# Patient Record
Sex: Male | Born: 1935
Health system: Southern US, Community
[De-identification: ages and names within clinical notes are randomized; demographics above are authoritative.]

## PROBLEM LIST (undated history)

## (undated) DIAGNOSIS — N400 Enlarged prostate without lower urinary tract symptoms: Secondary | ICD-10-CM

## (undated) DIAGNOSIS — M1990 Unspecified osteoarthritis, unspecified site: Secondary | ICD-10-CM

## (undated) DIAGNOSIS — N281 Cyst of kidney, acquired: Secondary | ICD-10-CM

## (undated) DIAGNOSIS — S32000A Wedge compression fracture of unspecified lumbar vertebra, initial encounter for closed fracture: Secondary | ICD-10-CM

## (undated) DIAGNOSIS — Z8673 Personal history of transient ischemic attack (TIA), and cerebral infarction without residual deficits: Secondary | ICD-10-CM

## (undated) DIAGNOSIS — H9193 Unspecified hearing loss, bilateral: Secondary | ICD-10-CM

## (undated) DIAGNOSIS — F0390 Unspecified dementia without behavioral disturbance: Secondary | ICD-10-CM

## (undated) DIAGNOSIS — R32 Unspecified urinary incontinence: Secondary | ICD-10-CM

## (undated) DIAGNOSIS — E7800 Pure hypercholesterolemia, unspecified: Secondary | ICD-10-CM

## (undated) DIAGNOSIS — E119 Type 2 diabetes mellitus without complications: Secondary | ICD-10-CM

## (undated) DIAGNOSIS — R413 Other amnesia: Secondary | ICD-10-CM

## (undated) DIAGNOSIS — H269 Unspecified cataract: Secondary | ICD-10-CM

## (undated) DIAGNOSIS — I10 Essential (primary) hypertension: Secondary | ICD-10-CM

## (undated) HISTORY — DX: Benign prostatic hyperplasia without lower urinary tract symptoms: N40.0

## (undated) HISTORY — DX: Unspecified cataract: H26.9

## (undated) HISTORY — DX: Unspecified urinary incontinence: R32

## (undated) HISTORY — DX: Unspecified hearing loss, bilateral: H91.93

## (undated) HISTORY — DX: Cyst of kidney, acquired: N28.1

## (undated) HISTORY — PX: BACK SURGERY: SHX140

## (undated) HISTORY — DX: Personal history of transient ischemic attack (TIA), and cerebral infarction without residual deficits: Z86.73

## (undated) HISTORY — DX: Essential (primary) hypertension: I10

## (undated) HISTORY — DX: Unspecified osteoarthritis, unspecified site: M19.90

## (undated) HISTORY — DX: Other amnesia: R41.3

## (undated) HISTORY — DX: Unspecified dementia, unspecified severity, without behavioral disturbance, psychotic disturbance, mood disturbance, and anxiety: F03.90

---

## 1998-06-12 ENCOUNTER — Ambulatory Visit (HOSPITAL_COMMUNITY): Admission: RE | Admit: 1998-06-12 | Discharge: 1998-06-12 | Payer: Self-pay | Admitting: Gastroenterology

## 2000-01-21 ENCOUNTER — Ambulatory Visit (HOSPITAL_COMMUNITY): Admission: RE | Admit: 2000-01-21 | Discharge: 2000-01-21 | Payer: Self-pay | Admitting: Gastroenterology

## 2000-01-21 ENCOUNTER — Encounter (INDEPENDENT_AMBULATORY_CARE_PROVIDER_SITE_OTHER): Payer: Self-pay | Admitting: Specialist

## 2000-02-11 ENCOUNTER — Encounter: Admission: RE | Admit: 2000-02-11 | Discharge: 2000-05-11 | Payer: Self-pay | Admitting: Gastroenterology

## 2000-06-22 ENCOUNTER — Encounter: Admission: RE | Admit: 2000-06-22 | Discharge: 2000-09-20 | Payer: Self-pay | Admitting: Gastroenterology

## 2000-08-02 HISTORY — PX: CARPAL TUNNEL RELEASE: SHX101

## 2001-01-19 ENCOUNTER — Encounter: Payer: Self-pay | Admitting: *Deleted

## 2001-01-23 ENCOUNTER — Ambulatory Visit (HOSPITAL_BASED_OUTPATIENT_CLINIC_OR_DEPARTMENT_OTHER): Admission: RE | Admit: 2001-01-23 | Discharge: 2001-01-23 | Payer: Self-pay | Admitting: Orthopedic Surgery

## 2001-03-27 ENCOUNTER — Encounter: Admission: RE | Admit: 2001-03-27 | Discharge: 2001-04-14 | Payer: Self-pay | Admitting: Orthopedic Surgery

## 2003-04-26 ENCOUNTER — Encounter: Payer: Self-pay | Admitting: Internal Medicine

## 2003-04-26 ENCOUNTER — Encounter: Admission: RE | Admit: 2003-04-26 | Discharge: 2003-04-26 | Payer: Self-pay | Admitting: Internal Medicine

## 2003-08-03 HISTORY — PX: HEMORRHOID SURGERY: SHX153

## 2004-02-13 ENCOUNTER — Ambulatory Visit (HOSPITAL_COMMUNITY): Admission: RE | Admit: 2004-02-13 | Discharge: 2004-02-13 | Payer: Self-pay | Admitting: General Surgery

## 2004-02-13 ENCOUNTER — Encounter (INDEPENDENT_AMBULATORY_CARE_PROVIDER_SITE_OTHER): Payer: Self-pay | Admitting: Specialist

## 2004-08-02 HISTORY — PX: TRANSURETHRAL RESECTION OF PROSTATE: SHX73

## 2004-09-17 ENCOUNTER — Ambulatory Visit (HOSPITAL_COMMUNITY): Admission: RE | Admit: 2004-09-17 | Discharge: 2004-09-17 | Payer: Self-pay | Admitting: Gastroenterology

## 2004-09-17 ENCOUNTER — Encounter (INDEPENDENT_AMBULATORY_CARE_PROVIDER_SITE_OTHER): Payer: Self-pay | Admitting: Specialist

## 2005-05-26 ENCOUNTER — Inpatient Hospital Stay (HOSPITAL_COMMUNITY): Admission: RE | Admit: 2005-05-26 | Discharge: 2005-05-28 | Payer: Self-pay | Admitting: Urology

## 2005-05-26 ENCOUNTER — Encounter (INDEPENDENT_AMBULATORY_CARE_PROVIDER_SITE_OTHER): Payer: Self-pay | Admitting: Specialist

## 2006-01-27 ENCOUNTER — Encounter: Admission: RE | Admit: 2006-01-27 | Discharge: 2006-01-27 | Payer: Self-pay | Admitting: Endocrinology

## 2006-02-24 ENCOUNTER — Ambulatory Visit (HOSPITAL_COMMUNITY): Admission: RE | Admit: 2006-02-24 | Discharge: 2006-02-24 | Payer: Self-pay | Admitting: Urology

## 2006-04-29 ENCOUNTER — Inpatient Hospital Stay (HOSPITAL_COMMUNITY): Admission: RE | Admit: 2006-04-29 | Discharge: 2006-05-03 | Payer: Self-pay | Admitting: Urology

## 2006-04-29 ENCOUNTER — Encounter (INDEPENDENT_AMBULATORY_CARE_PROVIDER_SITE_OTHER): Payer: Self-pay | Admitting: Specialist

## 2006-05-05 ENCOUNTER — Emergency Department (HOSPITAL_COMMUNITY): Admission: EM | Admit: 2006-05-05 | Discharge: 2006-05-06 | Payer: Self-pay | Admitting: Specialist

## 2006-05-06 ENCOUNTER — Ambulatory Visit (HOSPITAL_COMMUNITY): Admission: RE | Admit: 2006-05-06 | Discharge: 2006-05-06 | Payer: Self-pay | Admitting: Urology

## 2006-05-06 ENCOUNTER — Encounter: Payer: Self-pay | Admitting: Vascular Surgery

## 2007-02-24 ENCOUNTER — Ambulatory Visit: Payer: Self-pay | Admitting: Internal Medicine

## 2007-03-23 ENCOUNTER — Ambulatory Visit: Payer: Self-pay | Admitting: Internal Medicine

## 2007-06-21 ENCOUNTER — Encounter: Payer: Self-pay | Admitting: Internal Medicine

## 2007-06-21 DIAGNOSIS — E119 Type 2 diabetes mellitus without complications: Secondary | ICD-10-CM | POA: Insufficient documentation

## 2007-07-19 ENCOUNTER — Ambulatory Visit: Payer: Self-pay | Admitting: Internal Medicine

## 2007-07-19 DIAGNOSIS — J8410 Pulmonary fibrosis, unspecified: Secondary | ICD-10-CM

## 2007-10-24 ENCOUNTER — Ambulatory Visit: Payer: Self-pay | Admitting: Internal Medicine

## 2007-11-28 ENCOUNTER — Encounter (INDEPENDENT_AMBULATORY_CARE_PROVIDER_SITE_OTHER): Payer: Self-pay | Admitting: *Deleted

## 2007-12-03 ENCOUNTER — Encounter: Payer: Self-pay | Admitting: Internal Medicine

## 2007-12-04 ENCOUNTER — Telehealth (INDEPENDENT_AMBULATORY_CARE_PROVIDER_SITE_OTHER): Payer: Self-pay | Admitting: *Deleted

## 2009-02-04 ENCOUNTER — Encounter: Admission: RE | Admit: 2009-02-04 | Discharge: 2009-02-04 | Payer: Self-pay | Admitting: Sports Medicine

## 2009-09-04 ENCOUNTER — Ambulatory Visit: Payer: Self-pay | Admitting: Internal Medicine

## 2009-09-04 DIAGNOSIS — R05 Cough: Secondary | ICD-10-CM

## 2009-09-04 DIAGNOSIS — R059 Cough, unspecified: Secondary | ICD-10-CM | POA: Insufficient documentation

## 2010-04-07 ENCOUNTER — Telehealth (INDEPENDENT_AMBULATORY_CARE_PROVIDER_SITE_OTHER): Payer: Self-pay | Admitting: *Deleted

## 2010-04-14 ENCOUNTER — Encounter: Payer: Self-pay | Admitting: Internal Medicine

## 2010-04-21 ENCOUNTER — Ambulatory Visit: Payer: Self-pay | Admitting: Internal Medicine

## 2010-06-03 ENCOUNTER — Telehealth: Payer: Self-pay | Admitting: Internal Medicine

## 2010-06-08 ENCOUNTER — Telehealth: Payer: Self-pay | Admitting: Internal Medicine

## 2010-06-11 ENCOUNTER — Telehealth (INDEPENDENT_AMBULATORY_CARE_PROVIDER_SITE_OTHER): Payer: Self-pay | Admitting: *Deleted

## 2010-06-12 ENCOUNTER — Ambulatory Visit (HOSPITAL_COMMUNITY)
Admission: RE | Admit: 2010-06-12 | Discharge: 2010-06-12 | Payer: Self-pay | Source: Home / Self Care | Admitting: Internal Medicine

## 2010-06-12 ENCOUNTER — Encounter: Payer: Self-pay | Admitting: Internal Medicine

## 2010-07-24 ENCOUNTER — Ambulatory Visit: Payer: Self-pay | Admitting: Internal Medicine

## 2010-07-29 ENCOUNTER — Encounter: Payer: Self-pay | Admitting: Internal Medicine

## 2010-08-02 DIAGNOSIS — S32000A Wedge compression fracture of unspecified lumbar vertebra, initial encounter for closed fracture: Secondary | ICD-10-CM

## 2010-08-02 HISTORY — DX: Wedge compression fracture of unspecified lumbar vertebra, initial encounter for closed fracture: S32.000A

## 2010-08-05 ENCOUNTER — Ambulatory Visit (HOSPITAL_COMMUNITY)
Admission: RE | Admit: 2010-08-05 | Discharge: 2010-08-05 | Payer: Self-pay | Source: Home / Self Care | Attending: Internal Medicine | Admitting: Internal Medicine

## 2010-09-03 NOTE — Assessment & Plan Note (Signed)
Summary: Pulmonary/ ext ov restart prilosec and pepcid   Primary Provider/Referring Provider:  no primary  CC:  Acute visit.  Pt c/o cough x 2 months.  Cough is non prod.  Cough is worse when he eats.  He feels like food gets stuck in throat.  Marland Kitchen  History of Present Illness: 13 yowm  initially seen here in July 2008 for evaluation of a severe cough associated with probable musculoskeletal chest pain and very mild evidence of pulmonary fibrosis.  I reasoned that the cough was disproportionate  to the fibrosis and might be due to reflux.  Therefore Rx> Zegerd t40 mg at bedtime with apparent complete elimination of symptoms.   Last seen 10/29/07 rec maintain zegerid  September 04, 2009 much worse since stopped zegreid due to insurance issues but esp worse cough x 2 months.  Cough is non prod.  Cough is worse when he eats.  He feels like food gets stuck in throat.  Denies sign sob but very sedentary.  Pt denies any significant sore throat,itching, sneezing,  nasal congestion or excess secretions,  fever, chills, sweats, unintended wt loss, pleuritic or exertional cp, hempoptysis, change in activity tolerance  orthopnea pnd or leg swelling.  Pt also denies any obvious fluctuation in symptoms with weather or environmental change or other alleviating or aggravating factors.        Current Medications (verified): 1)  Mobic 7.5 Mg  Tabs (Meloxicam) .... Once Daily 2)  Crestor 5 Mg  Tabs (Rosuvastatin Calcium) 3)  Actos 45 Mg  Tabs (Pioglitazone Hcl) .... Once Daily 4)  Oxybutynin Chloride 10 Mg  Tb24 (Oxybutynin Chloride) .... Once Daily  Allergies (verified): 1)  ! Celebrex  Past History:  Past Medical History: DM (ICD-250.00)    Vital Signs:  Patient profile:   77 year old male Weight:      216.38 pounds O2 Sat:      97 % on Room air Temp:     97.9 degrees F oral Pulse rate:   69 / minute BP sitting:   122 / 80  (left arm)  Vitals Entered By: Vernie Murders (September 04, 2009 11:58  AM)  O2 Flow:  Room air  Physical Exam  Additional Exam:  amb hoarse wm in nad, very hard of hearing HEENT: nl dentition, turbinates, and orophanx. Nl external ear canals without cough reflex. Frequent throat clearing. Neck without JVD/Nodes/TM Lungs clear to A and P bilaterally without cough on insp or exp maneuvers, mild pseudowheeze was appreciated RRR no s3 or murmur or increase in P2 Abd soft and benign with nl excursion in the supine position. No bruits or organomegaly Ext warm without calf tenderness, cyanosis clubbing or edema Skin warm and dry without lesions    CXR  Procedure date:  09/04/2009  Findings:      mild pf without progression  Impression & Recommendations:  Problem # 1:  COUGH (ICD-786.2) The most common causes of chronic cough in immunocompetent adults include: upper airway cough syndrome (UACS), previously referred to as postnasal drip syndrome,  caused by variety of rhinosinus conditions; (2) asthma; (3) GERD; (4) chronic bronchitis from cigarette smoking or other inhaled environmental irritants; (5) nonasthmatic eosinophilic bronchitis; and (6) bronchiectasis. These conditions, singly or in combination, have accounted for up to 94% of the causes of chronic cough in prospective studies.  Based on assoc dysphagia and response to zegerid this is almost certainly Upper airway cough syndrome, so named because it's frequently impossible to sort  out how much is LPR vs CR/sinusitis with freq throat clearing generating secondary extra esophageal GERD from wide swings in gastric pressure that occur with throat clearing, promoting self use of mint and menthol lozenges that reduce the lower esophageal sphincter tone and exacerbate the problem further.  These symptoms are easily confused with asthma/copd by even experienced pulmonogists because they overlap so much. These are the same pts who not infrequently have failed to tolerate ace inhibitors,  dry powder inhalers or  biphosphonates or report having reflux symptoms that don't respond to standard doses of PPI   See instructions for specific recommendations   Problem # 2:  PULMONARY FIBROSIS ILD POST INFLAMMATORY CHRONIC (ICD-515) No evidence of progression and likely related to GERD though may not be cause and effect.  Other Orders: Est. Patient Level IV (99214) T-2 View CXR (71020TC)  Patient Instructions: 1)  Start Prilosec 20 mg Take  one 30-60 min before first meal of the day and pepcid 20 mg at bedtime  2)  GERD (REFLUX)  is a common cause of respiratory symptoms. It commonly presents without heartburn and can be treated with medication, but also with lifestyle changes including avoidance of late meals, excessive alcohol, smoking cessation, and avoid fatty foods, chocolate, peppermint, colas, red wine, and acidic juices such as orange juice. NO MINT OR MENTHOL PRODUCTS SO NO COUGH DROPS  3)  USE SUGARLESS CANDY INSTEAD (jolley ranchers)  4)  NO OIL BASED VITAMINS  5)  Please schedule a follow-up appointment in 6 months with cxr, sooner if condition worsens  Appended Document: Pulmonary/ ext ov restart prilosec and pepcid copy to Dr Dagoberto Ligas  Appended Document: Pulmonary/ ext ov restart prilosec and pepcid Copy was faxed to [facility] via [facility].

## 2010-09-03 NOTE — Assessment & Plan Note (Signed)
Summary: Pulmonary/ ext f/u ov for dysphagia/ ? es dysmotility   Primary Rodney Wigger/Referring Jaala Bohle:  Trula Slade   CC:  Cough- the same.  History of Present Illness: 17 yowm  initially seen here in July 2008 for evaluation of a severe cough associated with probable musculoskeletal chest pain and very mild evidence of pulmonary fibrosis.  I reasoned that the cough was disproportionate  to the fibrosis and might be due to reflux.  Therefore Rx> Zegerd t40 mg at bedtime with apparent complete elimination of symptoms.   Last seen 10/29/07 rec maintain zegerid  September 04, 2009 much worse since stopped zegreid due to insurance issues but esp worse cough x 2 months.  Cough is non prod.  Cough is worse when he eats.  He feels like food gets stuck in throat.  Denies sign sob but very sedentary.  rec prilosec 20 mg one  ac and pepcid 20 mg one at bedtime never never followed these instructions, wife didn't think he had reflux because she says she has it and knows what this is (written explanation of the extraesophageal manifestations reviewed in writing on instruction sheet given to them but they ignored it).   April 21, 2010 6 month followup.  Pt states that cough has worsened since last seen.  Cough is dry and hacky and get worse after eating "gets strangled".  Breathing okay.   No h/o asp pna.  rec Prevacid (lansoprazole) 30 mg Take  one 30-60 min before first meal of the day  Pepcid 20 mg one at bedtime GERD (REFLUX)   ei35  If not convinced your better in one month arrange a barium swallow > done 06/12/10 D3 diet and full barium swallow rec but note done  July 24, 2010 ov cc cough no better but not compliant with ppi or diet. Pt denies any significant sore throat,, itching, sneezing,  nasal congestion or excess secretions,  fever, chills, sweats, unintended wt loss, pleuritic or exertional cp, hempoptysis, change in activity tolerance  orthopnea pnd or leg swelling.  Pt also denies any  obvious fluctuation in symptoms with weather or environmental change or other alleviating or aggravating factors.             Current Medications (verified): 1)  Mobic 7.5 Mg  Tabs (Meloxicam) .Marland Kitchen.. 1 Once Daily As Needed 2)  Crestor 5 Mg  Tabs (Rosuvastatin Calcium) .Marland Kitchen.. 1 Once Daily 3)  Actos 45 Mg  Tabs (Pioglitazone Hcl) .Marland Kitchen.. 1 Once Daily 4)  Oxybutynin Chloride 10 Mg  Tb24 (Oxybutynin Chloride) .Marland Kitchen.. 1 Once Daily 5)  Aspirin 81 Mg Tbec (Aspirin) .Marland Kitchen.. 1 Once Daily  Allergies (verified): 1)  ! Celebrex  Past History:  Past Medical History: DM (ICD-250.00) Chronic cough/ ? ild........................................Marland KitchenWert      - No desat x 3 laps x 185 each April 21, 2010       - ST eval 06/12/10 D3 Diet, thin liquids/ full swallow rec    Vital Signs:  Patient profile:   77 year old male Weight:      218 pounds O2 Sat:      97 % on Room air Temp:     97.6 degrees F oral Pulse rate:   78 / minute BP sitting:   110 / 78  (left arm)  Vitals Entered By: Vernie Murders (July 24, 2010 11:06 AM)  O2 Flow:  Room air  Physical Exam  Additional Exam:  amb hoarse wm in nad, very hard of hearing wt 216  April 21, 2010  > 218 July 24, 2010  HEENT: nl dentition, turbinates, and orophanx. Nl external ear canals without cough reflex. Frequent throat clearing. Neck without JVD/Nodes/TM Lungs minimal insp crackles on insp without cough on insp or exp maneuvers, mild pseudowheeze was appreciated RRR no s3 or murmur or increase in P2 Abd soft and benign with nl excursion in the supine position. No bruits or organomegaly Ext warm without calf tenderness, cyanosis clubbing or edema Skin warm and dry without lesions    Impression & Recommendations:  Problem # 1:  COUGH (ICD-786.2)  The most common causes of chronic cough in immunocompetent adults include: upper airway cough syndrome (UACS), previously referred to as postnasal drip syndrome,  caused by variety of  rhinosinus conditions; (2) asthma; (3) GERD; (4) chronic bronchitis from cigarette smoking or other inhaled environmental irritants; (5) nonasthmatic eosinophilic bronchitis; and (6) bronchiectasis. These conditions, singly or in combination, have accounted for up to 94% of the causes of chronic cough in prospective studies.  Based on assoc dysphagia and response to zegerid this is almost certainly Upper airway cough syndrome, so named because it's frequently impossible to sort out how much is LPR vs CR/sinusitis with freq throat clearing generating secondary extra esophageal GERD from wide swings in gastric pressure that occur with throat clearing, promoting self use of mint and menthol lozenges that reduce the lower esophageal sphincter tone and exacerbate the problem further.  These symptoms are easily confused with asthma/copd by even experienced pulmonogists because they overlap so much. These are the same pts who not infrequently have failed to tolerate ace inhibitors,  dry powder inhalers or biphosphonates or report having reflux symptoms that don't respond to standard doses of PPI   NB the  ramp to expected improvement (and for that matter, worsening, if a chronic effective medication is stopped)  can be measured in weeks, not days, a common misconception because this is not Heartburn with no immediate cause and effect relationship so that response to therapy or lack thereof can be very difficult to assess.  This is the same conversation we had on last ov. See instructions for specific recommendations - next step is full  barium swallow  Need to verify he's following instructions before adding new ones  Orders: Est. Patient Level IV (16109)  Problem # 2:  PULMONARY FIBROSIS ILD POST INFLAMMATORY CHRONIC (ICD-515)  No evidence of progression and likely related to GERD though may not be cause and effect.  Only way to tell is follow this over time and see if progression despite aggressive rx of  GERD  Medications Added to Medication List This Visit: 1)  Prevacid Solutab 30 Mg Tbdp (Lansoprazole) .... By mouth daily. take one half hour before eating. 2)  Pepcid 20 Mg Tabs (Famotidine) .... Take one by mouth at bedtime  Patient Instructions: 1)  Prevacid (lansoprazole) 30 mg Take  one 30-60 min before first meal of the day  2)  Pepcid 20 mg one at bedtime 3)  GERD (REFLUX)  is a common cause of respiratory symptoms. It commonly presents without heartburn and can be treated with medication, but also with lifestyle changes including avoidance of late meals, excessive alcohol, smoking cessation, and avoid fatty foods, chocolate, peppermint, colas, red wine, and acidic juices such as orange juice. NO MINT OR MENTHOL PRODUCTS SO NO COUGH DROPS  4)  USE SUGARLESS CANDY INSTEAD (jolley ranchers)  5)  NO OIL BASED VITAMINS  6)  Return to office in 3 months,  sooner if needed with cxr on return 7)  BRING YOUR MEDICATIONS WITH YOU!!!

## 2010-09-03 NOTE — Progress Notes (Signed)
Summary: nos appt  Phone Note Call from Patient   Caller: [email or web address]  Call For: wert Summary of Call: LMTCB x2 to rsc nos from 11/1. Initial call taken by: Darletta Moll,  June 03, 2010 3:01 PM

## 2010-09-03 NOTE — Miscellaneous (Signed)
Summary: Orders Update  Clinical Lists Changes  Orders: Added new Referral order of Misc. Referral (Misc. Ref) - Signed 

## 2010-09-03 NOTE — Letter (Signed)
Summary: [facility]  [address]   [place], [place] [address]   Phone: [phone]  Fax: [phone]    04/14/2010  Rica Mote [address] [place], [place]  [address]  Dear Mr. BASALDUA,   Our records indicate that you are now due for a followup appointment with Dr. Sandrea Hughs.  Please contact our office at [phone] to schedule appointment.  Thank You.        Sincerely,   Sandrea Hughs, MD

## 2010-09-03 NOTE — Progress Notes (Signed)
Summary: needs rov with cxr- letter mailed  ---- Converted from flag ---- ---- 04/05/2010 11:40 PM, Nyoka Cowden MD wrote: needs f/u ov with cxr and walking sats ------------------------------  [facility]  Vernie Murders  April 07, 2010 2:56 PM  LMOMTCB Vernie Murders  April 08, 2010 4:09 PM  LMOMTCB Vernie Murders  April 09, 2010 5:01 PM  Letter mailed Vernie Murders  April 14, 2010 11:46 AM

## 2010-09-03 NOTE — Progress Notes (Signed)
Summary: cough no better-schedule barium swallow  Phone Note Call from Patient   Caller: Patient Summary of Call: need to talk to nurse to find out what kind of test he is suppose to have done. Initial call taken by: Rickard Patience,  June 08, 2010 2:31 PM  Follow-up for Phone Call        [facility] Vernie Murders  June 08, 2010 3:46 PM  Returning phone call.Darletta Moll  June 09, 2010 11:56 AM  Spoke to pt wife and she states that the pt is no better and he wants to proceed with barium swallow. Per last OV note pt was to call and if no better per MW ok to order swallow. Order placed and sent to [facility]. pt wife aware they will be receiveing a call with appt. Carron Curie CMA  June 09, 2010 12:08 PM

## 2010-09-03 NOTE — Assessment & Plan Note (Signed)
Summary: Pulmonary/ ext ov with walking sats = ok x 185x3   Primary Provider/Referring Provider:  Trula Slade   CC:  6 month followup.  Pt states that cough has worsened since last seen.  Cough is dry and hacky and get worse after eating "gets strangled".  Breathing okay.[name] [name]  History of Present Illness: 39 yowm  initially seen here in July 2008 for evaluation of a severe cough associated with probable musculoskeletal chest pain and very mild evidence of pulmonary fibrosis.  I reasoned that the cough was disproportionate  to the fibrosis and might be due to reflux.  Therefore Rx> Zegerd t40 mg at bedtime with apparent complete elimination of symptoms.   Last seen 10/29/07 rec maintain zegerid  September 04, 2009 much worse since stopped zegreid due to insurance issues but esp worse cough x 2 months.  Cough is non prod.  Cough is worse when he eats.  He feels like food gets stuck in throat.  Denies sign sob but very sedentary.  rec prilosec 20 mg one  ac and pepcid 20 mg one at bedtime never never followed these instructions, wife didn't think he had reflux because she says she has it and knows what this is (written explanation of the extraesophageal manifestations reviewed in writing on instruction sheet given to them but they ignored it).   April 21, 2010 6 month followup.  Pt states that cough has worsened since last seen.  Cough is dry and hacky and get worse after eating "gets strangled".  Breathing okay.   No h/o asp pna.  Pt denies any significant sore throat, itching, sneezing,  nasal congestion or excess secretions,  fever, chills, sweats, unintended wt loss, pleuritic or exertional cp, hempoptysis, change in activity tolerance  orthopnea pnd or leg swelling/  cough much worse p eating and if lies down immediately after eating.  Pt also denies any obvious fluctuation in symptoms with weather or environmental change or other alleviating or aggravating factors.           Current Medications  (verified): 1)  Mobic 7.5 Mg  Tabs (Meloxicam) .[name] [name].. 1 Once Daily As Needed 2)  Crestor 5 Mg  Tabs (Rosuvastatin Calcium) .[name] [name].. 1 Once Daily 3)  Actos 45 Mg  Tabs (Pioglitazone Hcl) .[name] [name].. 1 Once Daily 4)  Oxybutynin Chloride 10 Mg  Tb24 (Oxybutynin Chloride) .[name] [name].. 1 Once Daily 5)  Aspirin 81 Mg Tbec (Aspirin) .[name] [name].. 1 Once Daily  Allergies (verified): 1)  ! Celebrex  Past History:  Past Medical History: DM (ICD-250.00) Chronic cough/ ? ild........................................[name] KitchenWert      - No desat x 3 laps x 185 each April 21, 2010     Vital Signs:  Patient profile:   77 year old male Height:      71 inches Weight:      216 pounds BMI:     30.23 O2 Sat:      96 % on Room air Temp:     97.9 degrees F oral Pulse rate:   69 / minute BP sitting:   132 / 78  (left arm)  Vitals Entered By: Vernie Murders (April 21, 2010 9:41 AM)  O2 Flow:  Room air  Serial Vital Signs/Assessments:  Comments: 10:18 AM Ambulatory Pulse Oximetry  Resting; HR_73____    02 Sat__96RA___  Lap1 (185 feet)   HR__88___   02 Sat__94RA___ Lap2 (185 feet)   HR_96____   02 Sat_92RA____    Lap3 (185 feet)   HR_100____   02  Sat_91____  _X__Test Completed without Difficulty  ___Test Stopped due to:  Elray Buba RN  April 21, 2010 10:25 AM  By: Elray Buba RN    Physical Exam  Additional Exam:  amb hoarse wm in nad, very hard of hearing wt 216 April 21, 2010  HEENT: nl dentition, turbinates, and orophanx. Nl external ear canals without cough reflex. Frequent throat clearing. Neck without JVD/Nodes/TM Lungs clear to A and P bilaterally without cough on insp or exp maneuvers, mild pseudowheeze was appreciated RRR no s3 or murmur or increase in P2 Abd soft and benign with nl excursion in the supine position. No bruits or organomegaly Ext warm without calf tenderness, cyanosis clubbing or edema Skin warm and dry without lesions    Clinical Reports  Reviewed:  CXR:  09/04/2009: CXR Results:  mild pf without progression   Impression & Recommendations:  Problem # 1:  COUGH (ICD-786.2)  The most common causes of chronic cough in immunocompetent adults include: upper airway cough syndrome (UACS), previously referred to as postnasal drip syndrome,  caused by variety of rhinosinus conditions; (2) asthma; (3) GERD; (4) chronic bronchitis from cigarette smoking or other inhaled environmental irritants; (5) nonasthmatic eosinophilic bronchitis; and (6) bronchiectasis. These conditions, singly or in combination, have accounted for up to 94% of the causes of chronic cough in prospective studies.  Based on assoc dysphagia and response to zegerid this is almost certainly Upper airway cough syndrome, so named because it's frequently impossible to sort out how much is LPR vs CR/sinusitis with freq throat clearing generating secondary extra esophageal GERD from wide swings in gastric pressure that occur with throat clearing, promoting self use of mint and menthol lozenges that reduce the lower esophageal sphincter tone and exacerbate the problem further.  These symptoms are easily confused with asthma/copd by even experienced pulmonogists because they overlap so much. These are the same pts who not infrequently have failed to tolerate ace inhibitors,  dry powder inhalers or biphosphonates or report having reflux symptoms that don't respond to standard doses of PPI   NB the  ramp to expected improvement (and for that matter, worsening, if a chronic effective medication is stopped)  can be measured in weeks, not days, a common misconception because this is not Heartburn with no immediate cause and effect relationship so that response to therapy or lack thereof can be very difficult to assess.  This is the same conversation we had on last ov. See instructions for specific recommendations - next step is barium swallow  Orders: Est. Patient Level IV  (04540)  Problem # 2:  PULMONARY FIBROSIS ILD POST INFLAMMATORY CHRONIC (ICD-515)  No evidence of progression and likely related to GERD though may not be cause and effect.  Only way to tell is follow this over time and see if progression despite aggressive rx of GERD  Medications Added to Medication List This Visit: 1)  Mobic 7.5 Mg Tabs (Meloxicam) .[name] [name].. 1 once daily as needed 2)  Crestor 5 Mg Tabs (Rosuvastatin calcium) .[name] [name].. 1 once daily 3)  Actos 45 Mg Tabs (Pioglitazone hcl) .[name] [name].. 1 once daily 4)  Oxybutynin Chloride 10 Mg Tb24 (Oxybutynin chloride) .[name] [name].. 1 once daily 5)  Aspirin 81 Mg Tbec (Aspirin) .[name] [name].. 1 once daily 6)  Prevacid Solutab 30 Mg Tbdp (Lansoprazole) .... By mouth daily. take one half hour before eating. 7)  Pepcid 20 Mg Tabs (Famotidine) .... Take one by mouth at bedtime  Other Orders: Pulse Oximetry, Ambulatory (98119)  Patient Instructions: 1)  Prevacid (lansoprazole) 30 mg Take  one 30-60 min before first meal of the day  2)  Pepcid 20 mg one at bedtime 3)  GERD (REFLUX)  is a common cause of respiratory symptoms. It commonly presents without heartburn and can be treated with medication, but also with lifestyle changes including avoidance of late meals, excessive alcohol, smoking cessation, and avoid fatty foods, chocolate, peppermint, colas, red wine, and acidic juices such as orange juice. NO MINT OR MENTHOL PRODUCTS SO NO COUGH DROPS  4)  USE SUGARLESS CANDY INSTEAD (jolley ranchers)  5)  NO OIL BASED VITAMINS  6)  If not convinced your better in one month, call Almyra Free [phone] to arrange a barium swallow before his office visit 6 weeks Prescriptions: PEPCID 20 MG TABS (FAMOTIDINE) take one by mouth at bedtime  #34 x 11   Entered and Authorized by:   Nyoka Cowden MD   Signed by:   Nyoka Cowden MD on 04/21/2010   Method used:   Faxed to ...       [facility] (mail-order)             , [place]         Ph: [phone]       Fax: [phone]   RxID:    [id number] PREVACID SOLUTAB 30 MG  TBDP (LANSOPRAZOLE) By mouth daily. Take one half hour before eating.  #34 x 11   Entered and Authorized by:   Nyoka Cowden MD   Signed by:   Nyoka Cowden MD on 04/21/2010   Method used:   Faxed to ...       [facility] (mail-order)             , [place]         Ph: [phone]       Fax: [phone]   RxID:   [id number]

## 2010-09-03 NOTE — Progress Notes (Signed)
Summary: barrium swallow  Phone Note From Other Clinic   Caller: connie at [facility] Call For: wert Summary of Call: requests to speak to libby re: barrium swallow tomorrow. call connie at [phone] Initial call taken by: Tivis Ringer, CNA,  June 11, 2010 4:27 PM  Follow-up for Phone Call        just wanted to make sure pt know about this appt  Follow-up by: Oneita Jolly,  June 11, 2010 4:42 PM

## 2010-10-30 ENCOUNTER — Other Ambulatory Visit: Payer: Self-pay | Admitting: Internal Medicine

## 2010-10-30 DIAGNOSIS — K5900 Constipation, unspecified: Secondary | ICD-10-CM

## 2010-11-05 ENCOUNTER — Ambulatory Visit
Admission: RE | Admit: 2010-11-05 | Discharge: 2010-11-05 | Disposition: A | Discharge status: Home / Self Care | Payer: Medicare Other | Source: Ambulatory Visit | Attending: Internal Medicine | Admitting: Internal Medicine

## 2010-11-05 DIAGNOSIS — K5900 Constipation, unspecified: Secondary | ICD-10-CM

## 2010-11-05 MED ORDER — IOHEXOL 300 MG/ML  SOLN
125.0000 mL | Freq: Once | INTRAMUSCULAR | Status: AC | PRN
  Administered 2010-11-05: 125 mL via INTRAVENOUS

## 2010-12-15 NOTE — Assessment & Plan Note (Signed)
[facility]                             PULMONARY OFFICE NOTE   GUILFORD, SHANNAHAN                     MRN:          [id number]  DATE:02/24/2007                            DOB:          [birth year]    CHIEF COMPLAINT:  Chest pain.   HISTORY:  This is an 77 year old white male who was last seen in March  of 2005 with dyspnea and cough that seemed to be related to meals and  had very mild fibrotic changes on CT scan.  I suspected but could not  prove that the entire problem was at least partially related to reflux  and recommended that he be maintained on PPI therapy with regular follow-  up.  He did not return until now but generally he did better until  developing worsening cough over the last several weeks with acute onset  of chest discomfort one week ago after coughing paroxysm.  He had  already been seen and diagnosed with a rib fracture and is maintained  now on Mobic 7.5 mg daily and is feeling a little bit better.  He  continues to have a hacking cough.   The patient denies any overt reflux symptoms and for that reason did not  resume any of his treatment for acid refluxes previously recommended.  He denies any excess sputum production, fevers, chills, sweats,  orthopnea, PND, or leg swelling.  He describes the chest pain as  slightly to the left of midline radiating through to his back but worse  anterior than posteriorly, worse standing than it is lying down and  definitely worse coughing.   PAST MEDICAL HISTORY:  1. Significant for ruptured disk surgery.  2. Diabetes.   ALLERGIES:  CELEBREX causes an upset stomach.   MEDICATIONS:  Mobic, Crestor, Actos, and oxybutynin.   SOCIAL HISTORY:  He has never smoked.  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  He denies any unusual exposure history.   FAMILY HISTORY:  Family history is taken in detail and essentially  negative for respiratory disease and history of  fibrosis.   REVIEW OF SYSTEMS:  Taken in detail and also negative except as outlined  above.  Interestingly he denies any increased dyspnea since the onset of  the chest pain.   PHYSICAL EXAMINATION:  This is a stoic, ambulatory white man in acute  distress.  He is afebrile with normal vital signs.  HEENT:  Unremarkable.  Pharynx clear.  LUNGS:  Lung fields are completely clear bilaterally.  HEART:  Regular rhythm without murmur, gallop, or rub.  ABDOMEN:  Soft, benign.  EXTREMITIES:  Warm without calf tenderness, cyanosis, clubbing, or  edema.   Saturation is 96% on room air.   Chest x-ray is pending.   IMPRESSION:  Chronic cough associated with very mild initial fibrosis at  his age with such indolent features strongly suggestive of low grade  reflux.  My notes indicate that he did better previously on reflux  regimen and therefore I recommended he restart Zegerid 40 mg at bedtime  and gave him four weeks' worth  of samples.  I would like him to return  at the end of four weeks for PFTs and in the meantime, if having either  pain or cough, continue to take the Tramadol every four hours.  If his  condition deteriorates during that time I would like him to see me  sooner.     Charlaine Dalton. Sherene Sires, MD, [facility]  Electronically Signed    MBW/MedQ  DD: 02/24/2007  DT: 02/25/2007  Job #: [id number]   cc:   [facility] - Marilynne Drivers. Dagoberto Ligas, M.D.

## 2010-12-18 NOTE — Discharge Summary (Signed)
NAME:  [name] [name], [name] [name] NO.:  [id number]   MEDICAL RECORD NO.:  [id number]          PATIENT TYPE:  INP   LOCATION:  1410                         FACILITY:  [facility]   PHYSICIAN:  Jamison Neighbor, M.D.  DATE OF BIRTH:  Aug 09, 1922   DATE OF ADMISSION:  05/26/2005  DATE OF DISCHARGE:  05/28/2005                                 DISCHARGE SUMMARY   DISCHARGE DIAGNOSES:  Benign prostatic hypertrophy/retention.   PROCEDURE:  Transurethral resection of the prostate.   SURGEON:  Jamison Neighbor, M.D.   CONSULTS:  None.   HOSPITAL COURSE:  The patient was admitted on May 26, 2005 and taken to  the operating room, where he underwent the above-named procedure, which he  tolerated without complication.  He was then taken to the urology floor in  stable condition, where he remained throughout his hospital stay, which was  uncomplicated.  Progressive toleration of ambulation, diet, and pain.  Throughout his hospital course, his continuous bladder irrigation was weaned  to off on postoperative day #2, and his Foley catheter was removed.  He was  given a trial of void, which he passed uneventfully.  At this time, he is in  stable condition to be discharged home.   DISCHARGE INSTRUCTIONS:  Patient is instructed to call or return if he  experiences any fever, chills, nausea or vomiting, increase in hematuria, or  clot retention. He is instructed not to drive while on narcotic pain  medications and not to involve himself in strenuous activity or heavy  lifting in the next several weeks.  He understands and agrees with these  instructions.   DISCHARGE MEDICATIONS:  1.  Vicodin.  2.  Colace.  3.  Septra DS x7 days.     ______________________________  Glade Nurse, MD      Jamison Neighbor, M.D.  Electronically Signed    MT/MEDQ  D:  05/28/2005  T:  05/28/2005  Job:  [id number]

## 2010-12-18 NOTE — Op Note (Signed)
NAME:  [name], [name] NO.:  [id number]   MEDICAL RECORD NO.:  [id number]          PATIENT TYPE:  AMB   LOCATION:  ENDO                         FACILITY:  [facility]   PHYSICIAN:  Danise Edge, M.D.   DATE OF BIRTH:  1923-02-24   DATE OF PROCEDURE:  09/17/2004  DATE OF DISCHARGE:                                 OPERATIVE REPORT   PROCEDURE INDICATION:  [name] [name] is a 77 year old male born 1922/11/28.  [name] [name] is scheduled to undergo a surveillance colonoscopy  with polypectomy to prevent colon cancer.   ENDOSCOPIST:  Danise Edge, M.D.   PREMEDICATION:  Versed 4 mg, Demerol 50 mg.   PROCEDURE:  After obtaining informed consent, [name] [name] was placed in the  left lateral decubitus position.  I administered intravenous Demerol and  intravenous Versed to achieve conscious sedation for the procedure.  The  patient's blood pressure, oxygen saturation, and cardiac rhythm were  monitored throughout the procedure and documented in the medical record.   Anal inspection and digital rectal exam were normal.  The prostate is non-  nodular.  The Olympus adjustable pediatric colonoscope was introduced into  the rectum and advanced to the cecum.  Colonic preparation for the exam  today was satisfactory.   Rectum:  A 1-mm polyp was removed from the mid rectum with the  electrocautery snare.   Sigmoid colon and descending colon:  A 1-mm polyp was removed from the  distal sigmoid colon at 30 cm from the anal verge.   Splenic flexure:  Normal.   Transverse colon:  Normal.   Hepatic flexure:  Normal.   Ascending colon:  Two 2-mm polyps were removed from the ascending colon with  the electrocautery snare.   Cecum and ileocecal valve:  A 2-mm polyp was removed with electrocautery  snare from the cecum.   ASSESSMENT:  1.  Universal colonic diverticulosis.  2.  A small polyp was removed from the cecum, two small polyps were removed      from the  ascending colon, a small polyp was removed from the distal      sigmoid colon, and a small polyp was removed from the mid rectum.      MJ/MEDQ  D:  09/17/2004  T:  09/17/2004  Job:  [id number]   cc:   Ike Bene, M.D.  [address]  [place]  [place] [address]  Fax: [phone]

## 2010-12-18 NOTE — Op Note (Signed)
NAME:  [name], [name] NO.:  [id number]   MEDICAL RECORD NO.:  [id number]          PATIENT TYPE:  INP   LOCATION:  1424                         FACILITY:  [facility]   PHYSICIAN:  Jamison Neighbor, M.D.  DATE OF BIRTH:  1922-08-13   DATE OF PROCEDURE:  04/29/2006  DATE OF DISCHARGE:                                 OPERATIVE REPORT   PREOPERATIVE DIAGNOSIS:  1. Right renal tumor.  2. Right renal cyst.   POSTOPERATIVE DIAGNOSIS:  1. Right renal tumor  2. Right renal cyst.   PROCEDURES PERFORMED:  1. Right open partial nephrectomy.  2. Unroofing of right renal cyst (open).   SURGEON:  Dr. Logan Bores.   ASSISTANT:  Cornelious Bryant, M.D.   ANESTHESIA:  General.   SPECIMENS:  1. Right renal tumor.  2. Right renal tumor base.  3. Right renal cyst roof.   SPECIMEN:   DISPOSITION OF PATH SPECIMEN:  Permanent and frozen sections.   ESTIMATED BLOOD LOSS:  400 mL.   COMPLICATIONS:  None.   INDICATIONS:  This is a 77 year old gentleman who was found to have an  incidental right renal cyst on ultrasound.  This was followed by an MRI that  revealed benign simple right renal cyst in the lower pole.  However, there  was another incidental 2 x 2.2 cm exophytic mass projecting from the  anterior surface of the middle pole consistent with renal cell carcinoma.  After extensive counseling the patient elected for right partial nephrectomy  with unroofing of his right cyst.   DESCRIPTION OF PROCEDURE:  The patient was brought to operating room.  Time-  out was taken to properly identify the patient and procedure going to be  done.  General anesthesia was induced.  Preoperative antibiotics were given.  The patient was placed in the flank position with the right side up.  The  table was flexed and the kidney rest was cranked up.  The patient was  prepped and draped in normal sterile fashion after the pressure points were  padded to avoid neuropathy, compartment syndrome, and  DVT precautions were  applied.  Right flank incision was made below the 11th rib.  The incision  extended from the midaxillary line anteriorly.  The length of the incision  was about 15 cm.  Dissection was then carried by electrocautery through  Scarpa fascia.  The two external muscular layers (external oblique and  internal oblique) were then divided by electrocautery carefully.  There was  no injury to any nerves.  Following the division of for aforementioned  muscular layers, the transversalis fascia was easily identified.  The fascia  was then sharply incised at the level of the 11th rib tip.  This provided  direct access to the retroperitoneum.  The peritoneal sac was carefully  reflected medially.  Once the retroperitoneum was accessed, a Bookwalter was  then applied and the skin with muscle layers were all retracted and exposure  was adequately obtained.  Gerota fascia was then entered and dissection was  carried down to the level of the right renal capsule.  The lateral aspect of  the right renal cyst was identified easily.  The roof of the cyst area was  cut and sent pathologic analysis.  The cyst was aspirated.  Indigo carmine  was given intraoperatively and after a period of 5-10 minutes later, there  was now blue dye coming out of the urine in the Foley catheter that was  inserted at the beginning of the case turned blue.  The cyst was packed with  Gelfoam., its edges were fulgurated with the argon beam.   Attention was then given to the anterior surface of the lower pole.  A 2 x 2  cm exophytic mass was noted to be extending from the anterior surface of the  lower pole.  This was consistent with renal cell carcinoma.  About 0.5 cm  margin was taken from the base of the tumor and the capsule was scorched by  Bovie.  Following the incision at the renal capsule, the back of the blade  was then used to dissect the tumor base with normal kidney parenchyma  dissected to the outside  of the  body.  This was successfully done and  frozen sections were sent intraoperatively from the base of the dissection.  The frozen sections were all benign with no evidence of any positive  resection margins.   An arterial bleeder was noted and was ligated with 2-0 Vicryl as a figure-of-  8.  The argon beam was then applied to the resection bed and the margins of  resection with adequate hemostasis.  Two Vicryl stitches were applied on  each side of the resection on the medial and lateral aspect.  Following  fulguration of the base of the tumor, FloSeal was then applied followed by  Surgicel.  The Vicryl sutures that were initially applied at the edges of  the resection margins were then tied over the Surgicel, thus fixing it in  place.  The Tisseel was then applied above the Surgicel and the rest of the  Tisseel was applied over the Gelfoam and the unroofed renal cyst.  This  provided a nice seal above the resected sites.  The kidney was then engulfed  with Gelfoam.  Copious irrigation was then applied with good hemostasis  observed.  The peritoneum was closed with 2-0 Vicryl in a running fashion.  The superficial muscle layers were then closed in layers using #1 PDS after  a J-P drain was inserted through a stab wound close to the posterior end of  the wound.  The wound was then copiously irrigated.  Local anesthetic was  then applied to the wound bed.  The skin was closed with staples.  The  patient was then awakened from anesthesia and taken in stable condition to  PACU.   COMPLICATIONS:  None.   Please note that Dr. Logan Bores was present and participated in the entire  procedure as he was the primary surgeon.     ______________________________  Terie Purser, MD      Jamison Neighbor, M.D.  Electronically Signed    JH/MEDQ  D:  04/29/2006  T:  05/01/2006  Job:  [id number]

## 2010-12-18 NOTE — Assessment & Plan Note (Signed)
[facility]                             PULMONARY OFFICE NOTE   [name] [name], [name] [name]                     MRN:          [id number]  DATE:03/24/2007                            DOB:          1923/04/29    [name] [name] is an 77 year old white male, history of chest pain syndrome  which was more of a burning sensation.  He was placed onto my schedule  by accident.  He saw Dr. Sherene Sires at his original consultation on February 24, 2007 for chest pain.  Dr. Sherene Sires felt during this visit that most of the  symptom complex was chronic cough associated with very minimal  interstitial fibrosis and low-grade reflux.  He recommended Zegerid  which the patient has initiated, 40 mg a day for 4 weeks, and the  patient's chest pain has now resolved as has his cough.  He did also  give the patient tramadol, but the patient never filled this  prescription.  He is here today for pulmonary function testing.   PHYSICAL EXAMINATION:  Temp 97, blood pressure 124/80, pulse 69,  saturation 95% on room air.  CHEST:  Clear with dry rales at the bases.  CARDIAC EXAM:  Showed a regular rate and rhythm without S3, normal S1,  S2.  ABDOMEN:  Soft, nontender.  EXTREMITIES:  Showed no edema or clubbing.   Pulmonary functions were obtained and show minimal mild restrictive  defect with a total lung capacity of 75% predicted, essentially normal  spirometry, and very mild reduction in defusion capacity at 68%  predicted.  These values are essentially unchanged compared to 2004  values.   IMPRESSION:  1. Very mild interstitial fibrosis with mild restrictive defect and      mild reduction in defusion capacity, not enough to account for      patient's symptom complex, and not enough to require therapy.  Plan      for this is to watch patient expectantly.  2. Reflux disease with positive response to Zegerid.  Additional      samples were given, and the patient is instructed to follow the  reflux diet already given to the patient from the previous visit.      He will return to see Dr. Sherene Sires for 1 more check in 3 months.     Charlcie Cradle Delford Field, MD, [facility]  Electronically Signed    PEW/MedQ  DD: 03/24/2007  DT: 03/25/2007  Job #: [id number]   cc:   Ike Bene, M.D.  Alfonse Alpers. Dagoberto Ligas, M.D.

## 2010-12-18 NOTE — Op Note (Signed)
[facility]  Patient:    [name] [name], [name] [name]                       MRN: [id number] Proc. Date: 01/23/01 Adm. Date:  [id number] Attending:  Twana First                           Operative Report  PREOPERATIVE DIAGNOSES:  Right carpal tunnel syndrome.  POSTOPERATIVE DIAGNOSES:  Right carpal tunnel syndrome.  PROCEDURE:  Right carpal tunnel release.  SURGEON:  Elana Alm. Thurston Hole, M.D.  ASSISTANT:  Kirstin A. Shepperson, P.A.  ANESTHESIA:  IV regional.  OPERATIVE TIME:  20 minutes.  COMPLICATIONS:  None.  INDICATIONS FOR PROCEDURE:  Mr. Timothy is a 77 year old gentleman who has had significantly progressive right carpal tunnel syndrome resistant to conservative care and is now to undergo right carpal tunnel release.  DESCRIPTION:  Mr. Jowers was brought to the operating room on January 23, 2001, placed on the operative table in supine position.  After an adequate level of IV regional anesthesia was obtained his right hand and arm was prepped using sterile Betadine and draped using a sterile technique.  Initially through a 4 cm palmar incision initial exposure was made.  The underlying subcutaneous tissues were incised along with the skin incision.  The transverse carpal ligament was exposed and carefully incised at the wrist flexion crease and then a hemostat used to protect the median nerve while the transverse carpal ligament was released completely distally to the level of the superficial palmar arch carefully protecting this and proximally releasing this approximately 2-3 inches proximal to the wrist flexor crease carefully protecting the palmar cutaneous branch of the median nerve.  The median nerve itself was found to be significantly flattened, but no other pathology noted. The wound was then irrigated and closed with interrupted 4-0 nylon Matris sutures and injected with 0.25% Marcaine.  Sterile compressive dressings were applied.   Tourniquet was released.  Patient then taken to recovery room in stable condition.  FOLLOWUP CARE:  Mr. Alldredge will be followed as an outpatient on Vicodin for pain.  See me back in the office in a week for wound check and followup. DD:  01/23/01 TD:  01/23/01 Job: [id number]

## 2010-12-18 NOTE — H&P (Signed)
NAME:  [name], [name]:  [id number]   MEDICAL RECORD NO.:  [id number]          PATIENT TYPE:  INP   LOCATION:  1424                         FACILITY[facility]   PHYSICIAN:  Jamison Neighbor, M.D.  DATE OF BIRTH:  June 12, 1923   DATE OF ADMISSION:  04/29/2006  DATE OF DISCHARGE:                                HISTORY & PHYSICAL   CHIEF COMPLAINT:  Right renal mass and right renal cyst.   HISTORY OF PRESENT ILLNESS:  [name] is an 77 year old gentleman who has  been followed with Dr. Logan Bores for BPH.  He initially had urinary retention  that persisted despite alpha blockers and 5 alpha reductase inhibitors.  The  patient underwent TURP with bladder stone extraction at the time.  Postoperatively, the patient developed urgency and frequency and was noted  to have study which was consistent with a markedly trabeculated bladder at  the time of surgery.  He was also known to have a complex cyst of the  kidney.  An MRI was done recently that revealed the non-complex and simple  nature of that cyst.  However,  another mass was noted  on the right lower  end of his kidney.  This was consistent with renal cell carcinoma.  Patient  otherwise denies any gross hematuria, recurrent urinary infections or  nephrolithiasis.   PAST MEDICAL HISTORY:  1. History consistent with BPH.  2. Diabetes.   PAST SURGICAL HISTORY:  Status post TURP.   MEDICATIONS:  Actos, Mobic, Crestor, Oxybutynin, Flex-A-Min,  , and prostate  support herbal medication.   ALLERGIES:  CELEBREX CAUSING GI UPSET.   REVIEW OF SYSTEMS:  As above, otherwise is negative.   SOCIAL HISTORY:  History are significant for diabetes in the family, cancer  in his mother.  Patient denies any alcohol or tobacco abuse.   PHYSICAL EXAMINATION:  GENERAL:  Patient is in no acute distress.  Is awake,  alert and oriented.  He is afebrile.  VITAL SIGNS:  Stable.  HEENT:  Pupils are equal, round and reactive to light,  extraocular motions  are intact.  NECK:  Patient has no cervical lymphadenopathy.  He has no thyromegaly.  He  has clear bowel sounds bilaterally.  He is in regular rate and rhythm.  ABDOMEN:  Soft, nontender.  He has no CVA tenderness.  NEUROLOGIC:  Gross motor neurological examination does not reveal any focal  deficits.  GU:  Examination reveals circumcised penis with penile implant prosthesis, a  cylinder with a pump in the scrotum.  Otherwise, his testes are descended  with no palpable masses.   Review of his MRI did reveal a right cyst and a complex mass on the right  inferior pole of his kidney.  After extensive counseling, the patient  elected for a right partial nephrectomy and removal  of his right renal  cyst.  The patient will be admitted to the hospital following surgery, for  post-op care.     ______________________________  Terie Purser, MD      Jamison Neighbor, M.D.  Electronically Signed    JH/MEDQ  D:  04/29/2006  T:  04/30/2006  Job:  [id number]

## 2010-12-18 NOTE — Op Note (Signed)
[facility]  Patient:    [name] [name], [name] [name]                       MRN: [id number] Proc. Date: 01/21/00 Attending:  Verlin Grills, M.D.                           Operative Report  PROCEDURE:  Colonoscopy.  INDICATIONS FOR PROCEDURE:  In June 1996, the patient underwent diagnostic colonoscopy.  A small tubular adenomatous polyp was removed from the ascendiNG colon and a hyperplastic polyp was removed from the proximal transverse colon. The patient is due for surveillance colonoscopy.  I discussed with the patient the complications associated with colonoscopy and polypectomy including a 15 per thousand risk of bleeding and a 4 per thousand risk of intestinal perforation.  The patient has signed the operative permit.  The patient was recently diagnosed with glucosuria.  He is due for a fasting blood sugar and hemoglobin A1c to determine if he has diabetes.  MEDICATION ALLERGIES:  None.  PAST MEDICAL HISTORY:  Gastroesophageal reflux disease associated with a normal esophagogastroduodenoscopy on June 12, 1998.  Internal hemorrhoids treated by Dr. Kendrick Ranch.  History of colon polyps.  Prostatic enlargement. Disk surgery.  Herniorrhaphy.  ENDOSCOPIST:  Verlin Grills, M.D.  PREMEDICATION:  Demerol 50 mg and Versed 5 mg.  ENDOSCOPE:  Olympus pediatric colonoscope.  DESCRIPTION OF PROCEDURE:  After obtaining informed consent, the patient was placed in the left lateral decubitus position.  I administered intravenous Demerol and intravenous Versed to achieve conscious sedation for the procedure.  The patients blood pressure, oxygen saturations and cardiac rhythm were monitored throughout the procedure and documented in the medical record.  Anal inspection was normal.  Digitorectal exam was normal.  The prostate was non nodular.  The Olympus pediatric video colonoscope was introduced into the rectum and, under direct vision, advanced to  the cecum, identified by a normal appearing ileocecal valve.  The ileocecal valve was intubated and the distal 10 cm of ileum inspected.  Colonic preparation for the exam today was excellent.  The patient has universal colonic diverticulosis without evidence of diverticulitis.  RECTUM AND SIGMOID COLON:  From the distal sigmoid colon - proximal rectum, three 1-3 mm polyps were removed with the electrocautery snare and submitted for pathologic interpretation.  DESCENDING COLON:  Normal.  SPLENIC FLEXURE:  Normal.  TRANSVERSE COLON:  Normal.  HEPATIC FLEXURE:  Normal.  ASCENDING COLON:  From the proximal ascending colon, three 1-3 mm sessile polyps were removed with the electrocautery snare and submitted for pathologic interpretation.  CECUM AND ILEOCECAL VALVE:  Normal.  DISTAL ILEUM:  Normal.  ASSESSMENT: 1. Universal colonic diverticulosis. 2. From the proximal transverse colon, three 1-3 mm ascending colon polyps    were removed. 3. From the distal sigmoid-proximal rectum, three 1-3 mm sessile polyps were    removed with electrocautery snare.  FASTING LABORATORY DATA PENDING:  CBC, lipid profile, hemoglobin A1c, complete metabolic profile and thyroid stimulating hormone level. DD:  01/21/00 TD:  01/21/00 Job: [id number]

## 2010-12-18 NOTE — Op Note (Signed)
NAME:  [name] [name], [name] [name]                        ACCOUNT NO.:  [id number]   MEDICAL RECORD NO.:  [id number]                   PATIENT TYPE:  AMB   LOCATION:  DAY                                  FACILITY:  [facility]   PHYSICIAN:  Timothy E. Earlene Plater, M.D.              DATE OF BIRTH:  September 19, 1922   DATE OF PROCEDURE:  02/13/2004  DATE OF DISCHARGE:                                 OPERATIVE REPORT   PREOPERATIVE DIAGNOSIS:  Prolapsing hemorrhoids.   POSTOPERATIVE DIAGNOSIS:  Prolapsing hemorrhoids.   OPERATIVE PROCEDURE:  PPH hemorrhoidectomy.   SURGEON:  Timothy E. Earlene Plater, M.D.   ANESTHESIA:  General.   [name] [name] is a relatively healthy 77 year old Caucasian male with a long  history of hemorrhoidal problems that now has progressed to prolapse with  strenuous activity or bowel movements.  He has been treated conservatively  in the office without effect and after careful discussion, he wishes to  proceed with the surgery, and it has been carefully discussed including the  expectations and possible complications.  He was seen, identified, evaluated  by anesthesia.   He was taken to the operating room, placed supine, general endotracheal  anesthesia administered.  He was carefully placed in lithotomy, perianal  area inspected, prepped and draped in the usual fashion.  The anus and  rectum were examined, and then there was considerable stool in the rectal  vault.  With a large anoscope, the rectal vault was cleansed out, irrigated,  and reprepped with Betadine.  Marcaine, epinephrine, and Wydase were  injected round about the anal orifice for a wide field block.  The anus  dilated nicely.  The operating anoscope placed and beginning posteriorly,  approximately 4 cm from the dentate line, a pursestring suture of 2-0  Prolene was carefully placed under direct vision.  On checking this with the  examining finger, it felt secure and complete.  Then the clear plastic PPH  anoscope was inserted.   The sutures were drawn through the center of that  anoscope, and then the completely open PPH stapler was gently placed through  the anoscope into the rectal canal and popped through the pursestring  suture.  The pursestring suture was carefully tied about the shaft and the  PPH.  The strings were brought out down the shaft of the PPH and then with  careful positioning and angulation, the PPH device was carefully closed and  held for 60 seconds, fired, and held for 60 seconds, and then removed.  Inspection of the specimen revealed an incomplete ring.  After 5 minutes,  the scope was reinserted and indeed, there was a complete ring with the  right anterior rectal mucosa having been left out of the pursestring suture.  A couple of small bleeding points were in the posterior and left anterior  were suture ligated with the 4-0 Vicryl.  Otherwise, the suture line was  intact, in place, and appropriate.  The right anterior area was inspected.  The 2 hemorrhoidal protrusions in this area were band-ligated.  With this,  the procedure was complete, all areas checked for bleeding; none was found,  Gelfoam gauze packed in the anal canal,  and an external dressing  applied.  All counts correct.  He tolerated it  well and was removed to the recovery room in good condition.   Written and verbal instructions given including Vicodin and be seen and  followed as an outpatient.                                               Timothy E. Earlene Plater, M.D.    TED/MEDQ  D:  02/13/2004  T:  02/13/2004  Job:  [id number]   cc:   Ike Bene, M.D.  [address]  [place]  [place] [address]  Fax: [phone]

## 2010-12-18 NOTE — Op Note (Signed)
NAME:  [name] [name], [name] [name] NO.:  [id number]   MEDICAL RECORD NO.:  [id number]          PATIENT TYPE:  INP   LOCATION:  0003                         FACILITY:  [facility]   PHYSICIAN:  Jamison Neighbor, M.D.  DATE OF BIRTH:  1922-09-14   DATE OF PROCEDURE:  05/26/2005  DATE OF DISCHARGE:                                 OPERATIVE REPORT   PREOPERATIVE DIAGNOSIS:  Retention.   POSTOPERATIVE DIAGNOSES:  Retention.   PROCEDURE:  Transurethral resection of prostate.   SURGEON:  Marcelyn Bruins, MD.   ASSISTANT:  Glade Nurse, MD.   ANESTHESIA:  General endotracheal.   SPECIMENS:  Prostatic chips.   PROCEDURE:  The patient was identified by his wrist bracelet and brought to  room 8 where he received preprocedure antibiotics and was administered  general anesthesia. He was prepped and draped in the usual sterile fashion  taking care to minimize peripheral neuropathy and compartment syndrome. Next  a 24-French rigid resectoscope sheath was placed with the blind obturator to  the level of the bladder. The obturator was removed and resectoscope. A  thin loop was placed, we inspected his  trigone which was without  abnormality, the ureteral orifices were effluxing clear urine in their  normal anatomic position. We next examined the prostate. It is a somewhat  tight bladder neck and a normal prostatic urethral length. We then created a  trough at the 6 o'clock position between the bladder neck and the  verumontanum. We then resected  between the 6 o'clock and 12 o'clock  position on the patient's left between the bladder neck and the verumontanum  controlling hemostasis as we went. Once we were happy with our depth of  resection, we repeated this process from 6 o'clock until 12 o'clock from the  bladder neck into the verumontanum on the patient's right. Again we maintain  hemostasis throughout. Once a satisfactory depth was reached, we terminated  our resection, the bladder neck  was noted to be open. Hemostasis was noted  throughout the prostatic fossa. We next removed the resectoscope and all TUR  chips were irrigated from the patient's bladder using the [facility] syringe. We  then reinspected the patient's bladder, it was found to be without foreign  body. The  ureteral orifices were not involved in the resection. The prostatic fossa  was noted to be markedly hemostatic. The resectoscope was removed. A three-  way Foley catheter was placed using a catheter guide. We were able to  irrigate it quantitatively with clear urine output. Please note Dr. Marcelyn Bruins was present and participated in this entire case.     ______________________________  Glade Nurse, MD      Jamison Neighbor, M.D.  Electronically Signed    MT/MEDQ  D:  05/26/2005  T:  05/26/2005  Job:  [id number]

## 2011-01-07 ENCOUNTER — Other Ambulatory Visit: Payer: Self-pay | Admitting: Dermatology

## 2011-07-08 ENCOUNTER — Other Ambulatory Visit: Payer: Self-pay | Admitting: Dermatology

## 2011-08-12 DIAGNOSIS — M542 Cervicalgia: Secondary | ICD-10-CM | POA: Diagnosis not present

## 2011-08-17 DIAGNOSIS — E119 Type 2 diabetes mellitus without complications: Secondary | ICD-10-CM | POA: Diagnosis not present

## 2011-08-17 DIAGNOSIS — Z0000 Encounter for general adult medical examination without abnormal findings: Secondary | ICD-10-CM | POA: Diagnosis not present

## 2011-08-17 DIAGNOSIS — H9190 Unspecified hearing loss, unspecified ear: Secondary | ICD-10-CM | POA: Diagnosis not present

## 2011-08-17 DIAGNOSIS — E782 Mixed hyperlipidemia: Secondary | ICD-10-CM | POA: Diagnosis not present

## 2011-09-02 DIAGNOSIS — C44319 Basal cell carcinoma of skin of other parts of face: Secondary | ICD-10-CM | POA: Diagnosis not present

## 2011-10-05 ENCOUNTER — Other Ambulatory Visit: Payer: Self-pay | Admitting: Gastroenterology

## 2011-10-05 DIAGNOSIS — Z86010 Personal history of colonic polyps: Secondary | ICD-10-CM | POA: Diagnosis not present

## 2011-10-05 DIAGNOSIS — Z09 Encounter for follow-up examination after completed treatment for conditions other than malignant neoplasm: Secondary | ICD-10-CM | POA: Diagnosis not present

## 2011-10-05 DIAGNOSIS — K5730 Diverticulosis of large intestine without perforation or abscess without bleeding: Secondary | ICD-10-CM | POA: Diagnosis not present

## 2011-10-05 DIAGNOSIS — D126 Benign neoplasm of colon, unspecified: Secondary | ICD-10-CM | POA: Diagnosis not present

## 2011-11-19 DIAGNOSIS — M545 Low back pain: Secondary | ICD-10-CM | POA: Diagnosis not present

## 2011-12-01 DIAGNOSIS — M5137 Other intervertebral disc degeneration, lumbosacral region: Secondary | ICD-10-CM | POA: Diagnosis not present

## 2011-12-10 DIAGNOSIS — N400 Enlarged prostate without lower urinary tract symptoms: Secondary | ICD-10-CM | POA: Diagnosis not present

## 2011-12-10 DIAGNOSIS — N3941 Urge incontinence: Secondary | ICD-10-CM | POA: Diagnosis not present

## 2012-02-15 DIAGNOSIS — E119 Type 2 diabetes mellitus without complications: Secondary | ICD-10-CM | POA: Diagnosis not present

## 2012-02-15 DIAGNOSIS — E782 Mixed hyperlipidemia: Secondary | ICD-10-CM | POA: Diagnosis not present

## 2012-02-15 DIAGNOSIS — R413 Other amnesia: Secondary | ICD-10-CM | POA: Diagnosis not present

## 2012-05-02 DIAGNOSIS — R350 Frequency of micturition: Secondary | ICD-10-CM | POA: Diagnosis not present

## 2012-05-02 DIAGNOSIS — N400 Enlarged prostate without lower urinary tract symptoms: Secondary | ICD-10-CM | POA: Diagnosis not present

## 2012-06-02 ENCOUNTER — Other Ambulatory Visit: Payer: Self-pay | Admitting: Dermatology

## 2012-06-02 DIAGNOSIS — D485 Neoplasm of uncertain behavior of skin: Secondary | ICD-10-CM | POA: Diagnosis not present

## 2012-06-02 DIAGNOSIS — Z85828 Personal history of other malignant neoplasm of skin: Secondary | ICD-10-CM | POA: Diagnosis not present

## 2012-06-02 DIAGNOSIS — C44319 Basal cell carcinoma of skin of other parts of face: Secondary | ICD-10-CM | POA: Diagnosis not present

## 2012-06-02 DIAGNOSIS — C4441 Basal cell carcinoma of skin of scalp and neck: Secondary | ICD-10-CM | POA: Diagnosis not present

## 2012-06-06 DIAGNOSIS — R350 Frequency of micturition: Secondary | ICD-10-CM | POA: Diagnosis not present

## 2012-06-06 DIAGNOSIS — N3941 Urge incontinence: Secondary | ICD-10-CM | POA: Diagnosis not present

## 2012-06-06 DIAGNOSIS — N400 Enlarged prostate without lower urinary tract symptoms: Secondary | ICD-10-CM | POA: Diagnosis not present

## 2012-06-13 DIAGNOSIS — E782 Mixed hyperlipidemia: Secondary | ICD-10-CM | POA: Diagnosis not present

## 2012-06-13 DIAGNOSIS — K5900 Constipation, unspecified: Secondary | ICD-10-CM | POA: Diagnosis not present

## 2012-06-13 DIAGNOSIS — E119 Type 2 diabetes mellitus without complications: Secondary | ICD-10-CM | POA: Diagnosis not present

## 2012-07-20 DIAGNOSIS — E119 Type 2 diabetes mellitus without complications: Secondary | ICD-10-CM | POA: Diagnosis not present

## 2012-08-02 DIAGNOSIS — Z8673 Personal history of transient ischemic attack (TIA), and cerebral infarction without residual deficits: Secondary | ICD-10-CM

## 2012-08-02 HISTORY — DX: Personal history of transient ischemic attack (TIA), and cerebral infarction without residual deficits: Z86.73

## 2012-10-10 DIAGNOSIS — E782 Mixed hyperlipidemia: Secondary | ICD-10-CM | POA: Diagnosis not present

## 2012-10-10 DIAGNOSIS — R413 Other amnesia: Secondary | ICD-10-CM | POA: Diagnosis not present

## 2012-10-10 DIAGNOSIS — E119 Type 2 diabetes mellitus without complications: Secondary | ICD-10-CM | POA: Diagnosis not present

## 2012-11-30 ENCOUNTER — Other Ambulatory Visit: Payer: Self-pay | Admitting: Dermatology

## 2012-11-30 DIAGNOSIS — C44319 Basal cell carcinoma of skin of other parts of face: Secondary | ICD-10-CM | POA: Diagnosis not present

## 2012-11-30 DIAGNOSIS — L821 Other seborrheic keratosis: Secondary | ICD-10-CM | POA: Diagnosis not present

## 2012-11-30 DIAGNOSIS — C4441 Basal cell carcinoma of skin of scalp and neck: Secondary | ICD-10-CM | POA: Diagnosis not present

## 2012-11-30 DIAGNOSIS — D485 Neoplasm of uncertain behavior of skin: Secondary | ICD-10-CM | POA: Diagnosis not present

## 2012-11-30 DIAGNOSIS — L570 Actinic keratosis: Secondary | ICD-10-CM | POA: Diagnosis not present

## 2012-11-30 DIAGNOSIS — Z85828 Personal history of other malignant neoplasm of skin: Secondary | ICD-10-CM | POA: Diagnosis not present

## 2012-12-18 ENCOUNTER — Inpatient Hospital Stay (HOSPITAL_COMMUNITY)
Admission: EM | Admit: 2012-12-18 | Discharge: 2012-12-22 | DRG: 066 | Disposition: A | Discharge status: Home / Self Care | Payer: Medicare Other | Attending: Internal Medicine | Admitting: Internal Medicine

## 2012-12-18 ENCOUNTER — Encounter (HOSPITAL_COMMUNITY): Payer: Self-pay | Admitting: Unknown Physician Specialty

## 2012-12-18 ENCOUNTER — Inpatient Hospital Stay (HOSPITAL_COMMUNITY): Payer: Medicare Other

## 2012-12-18 ENCOUNTER — Emergency Department (HOSPITAL_COMMUNITY): Payer: Medicare Other

## 2012-12-18 DIAGNOSIS — F0390 Unspecified dementia without behavioral disturbance: Secondary | ICD-10-CM | POA: Diagnosis present

## 2012-12-18 DIAGNOSIS — R1310 Dysphagia, unspecified: Secondary | ICD-10-CM | POA: Diagnosis not present

## 2012-12-18 DIAGNOSIS — R4182 Altered mental status, unspecified: Secondary | ICD-10-CM | POA: Diagnosis present

## 2012-12-18 DIAGNOSIS — Z79899 Other long term (current) drug therapy: Secondary | ICD-10-CM

## 2012-12-18 DIAGNOSIS — I6350 Cerebral infarction due to unspecified occlusion or stenosis of unspecified cerebral artery: Principal | ICD-10-CM | POA: Diagnosis present

## 2012-12-18 DIAGNOSIS — J8410 Pulmonary fibrosis, unspecified: Secondary | ICD-10-CM | POA: Diagnosis present

## 2012-12-18 DIAGNOSIS — E7800 Pure hypercholesterolemia, unspecified: Secondary | ICD-10-CM | POA: Diagnosis present

## 2012-12-18 DIAGNOSIS — R4701 Aphasia: Secondary | ICD-10-CM

## 2012-12-18 DIAGNOSIS — I658 Occlusion and stenosis of other precerebral arteries: Secondary | ICD-10-CM | POA: Diagnosis not present

## 2012-12-18 DIAGNOSIS — E119 Type 2 diabetes mellitus without complications: Secondary | ICD-10-CM | POA: Diagnosis not present

## 2012-12-18 DIAGNOSIS — R4789 Other speech disturbances: Secondary | ICD-10-CM | POA: Diagnosis present

## 2012-12-18 DIAGNOSIS — E785 Hyperlipidemia, unspecified: Secondary | ICD-10-CM | POA: Diagnosis present

## 2012-12-18 DIAGNOSIS — R079 Chest pain, unspecified: Secondary | ICD-10-CM | POA: Diagnosis not present

## 2012-12-18 DIAGNOSIS — I639 Cerebral infarction, unspecified: Secondary | ICD-10-CM

## 2012-12-18 HISTORY — DX: Unspecified osteoarthritis, unspecified site: M19.90

## 2012-12-18 HISTORY — DX: Pure hypercholesterolemia, unspecified: E78.00

## 2012-12-18 HISTORY — DX: Type 2 diabetes mellitus without complications: E11.9

## 2012-12-18 LAB — TROPONIN I
Troponin I: <0.3 ng/mL (ref <0.30)
Troponin I: <0.3 ng/mL (ref <0.30)

## 2012-12-18 LAB — URINALYSIS, ROUTINE W REFLEX MICROSCOPIC
Bilirubin Urine: NEGATIVE
Glucose, UA: NEGATIVE mg/dL
Hgb urine dipstick: NEGATIVE
Ketones, ur: 40 mg/dL — AB
Leukocytes, UA: NEGATIVE
Nitrite: NEGATIVE
Protein, ur: NEGATIVE mg/dL
pH: 6.5 (ref 5.0–8.0)

## 2012-12-18 LAB — CBC
MCHC: 34.9 g/dL (ref 30.0–36.0)
MCV: 85.3 fL (ref 78.0–100.0)
Platelets: 121 10*3/uL — ABNORMAL LOW (ref 150–400)
Platelets: 126 10*3/uL — ABNORMAL LOW (ref 150–400)
RBC: 4.65 MIL/uL (ref 4.22–5.81)
RDW: 12.5 % (ref 11.5–15.5)
WBC: 9.2 10*3/uL (ref 4.0–10.5)

## 2012-12-18 LAB — COMPREHENSIVE METABOLIC PANEL
ALT: 9 U/L (ref 0–53)
AST: 16 U/L (ref 0–37)
Albumin: 3.5 g/dL (ref 3.5–5.2)
Alkaline Phosphatase: 51 U/L (ref 39–117)
BUN: 12 mg/dL (ref 6–23)
CO2: 23 mEq/L (ref 19–32)
Calcium: 8.6 mg/dL (ref 8.4–10.5)
Chloride: 104 mEq/L (ref 96–112)
Creatinine, Ser: 0.9 mg/dL (ref 0.50–1.35)
GFR calc Af Amer: 86 mL/min — ABNORMAL LOW (ref >90)
Sodium: 138 mEq/L (ref 135–145)
Total Bilirubin: 0.7 mg/dL (ref 0.3–1.2)

## 2012-12-18 LAB — CREATININE, SERUM
Creatinine, Ser: 0.84 mg/dL (ref 0.50–1.35)
GFR calc Af Amer: 88 mL/min — ABNORMAL LOW (ref >90)

## 2012-12-18 MED ORDER — ASPIRIN 300 MG RE SUPP
300.0000 mg | Freq: Every day | RECTAL | Status: DC
  Administered 2012-12-18 – 2012-12-19 (×2): 300 mg via RECTAL
  Filled 2012-12-18 (×2): qty 1

## 2012-12-18 MED ORDER — ACETAMINOPHEN 325 MG PO TABS: 650.0000 mg | ORAL_TABLET | ORAL | Status: DC | PRN

## 2012-12-18 MED ORDER — SENNOSIDES-DOCUSATE SODIUM 8.6-50 MG PO TABS: 1.0000 | ORAL_TABLET | Freq: Every evening | ORAL | Status: DC | PRN

## 2012-12-18 MED ORDER — ACETAMINOPHEN 650 MG RE SUPP: 650.0000 mg | RECTAL | Status: DC | PRN

## 2012-12-18 MED ORDER — ENOXAPARIN SODIUM 30 MG/0.3ML ~~LOC~~ SOLN
30.0000 mg | SUBCUTANEOUS | Status: DC
  Administered 2012-12-18 – 2012-12-20 (×3): 30 mg via SUBCUTANEOUS
  Filled 2012-12-18 (×4): qty 0.3

## 2012-12-18 MED ORDER — ONDANSETRON HCL 4 MG/2ML IJ SOLN: 4.0000 mg | Freq: Four times a day (QID) | INTRAMUSCULAR | Status: DC | PRN

## 2012-12-18 MED ORDER — SODIUM CHLORIDE 0.9 % IV SOLN
INTRAVENOUS | Status: DC
  Administered 2012-12-18: 23:00:00 via INTRAVENOUS

## 2012-12-18 MED ORDER — SODIUM CHLORIDE 0.9 % IV BOLUS (SEPSIS)
500.0000 mL | Freq: Once | INTRAVENOUS | Status: AC
  Administered 2012-12-18: 500 mL via INTRAVENOUS

## 2012-12-18 MED ORDER — SODIUM CHLORIDE 0.9 % IV SOLN: INTRAVENOUS | Status: DC

## 2012-12-18 NOTE — ED Notes (Signed)
NT tried to insert in and out cath x1, upon insertion resistance was met and the pt became very agitated while moving his legs around. NT unable to advance in and out catheter, RN notified.

## 2012-12-18 NOTE — ED Provider Notes (Signed)
History     CSN: [id number]  Arrival date & time 12/18/12  1504   First MD Initiated Contact with Patient 12/18/12 1516      Chief Complaint  Patient presents with  . Altered Mental Status    (Consider location/radiation/quality/duration/timing/severity/associated sxs/prior treatment) Patient is a 79 y.o. male presenting with altered mental status. The history is provided by the patient. The history is limited by the condition of the patient.  Altered Mental Status  pt not verbally responsive to questions - level 5 caveat. pts family notes in yesterday was essentially non verbal, occasional slurred words, was holding his head w hands. Didn't eat throughout course of day. Today also not verbal x occasional slurred word. Has been ambulatory today. No report of recent fall or head injury. Pt has not voiced c/o pain x for grabbing head occasionally. No recent fevers. No change in medication. bs normal on arrival to ed.   Past Medical History  Diagnosis Date  . Diabetes mellitus without complication     No past surgical history on file.  No family history on file.  History  Substance Use Topics  . Smoking status: Not on file  . Smokeless tobacco: Not on file  . Alcohol Use: Not on file      Review of Systems  Unable to perform ROS: Patient nonverbal  Psychiatric/Behavioral: Positive for altered mental status.  level 5 caveat.   Allergies  Celecoxib  Home Medications  No current outpatient prescriptions on file.  BP 174/78  Pulse 60  Temp(Src) 98.4 F (36.9 C) (Oral)  Resp 14  SpO2 96%  Physical Exam  Nursing note and vitals reviewed. Constitutional: He appears well-developed and well-nourished. No distress.  HENT:  Head: Atraumatic.  Eyes: Conjunctivae and EOM are normal. Pupils are equal, round, and reactive to light.  Neck: Normal range of motion. Neck supple. No tracheal deviation present. No thyromegaly present.  No stiffness or rigidity  Cardiovascular:  Normal rate, regular rhythm, normal heart sounds and intact distal pulses.   Pulmonary/Chest: Effort normal and breath sounds normal. No accessory muscle usage. No respiratory distress.  Abdominal: Soft. Bowel sounds are normal. He exhibits no distension. There is no tenderness.  Genitourinary:  No cva tendernesss  Musculoskeletal: Normal range of motion. He exhibits no edema and no tenderness.  Neurological: He is alert.  Alert appearing. Very hoh. Moves bil ext purposefully. Does not follow commands. Not verbally responsive.   Skin: Skin is warm and dry. He is not diaphoretic.  Psychiatric:  Alert, content.     ED Course  Procedures (including critical care time)  Results for orders placed during the hospital encounter of 12/18/12  CBC      Result Value Range   WBC 9.2  4.0 - 10.5 K/uL   RBC 4.63  4.22 - 5.81 MIL/uL   Hemoglobin 13.8  13.0 - 17.0 g/dL   HCT 78.2  95.6 - 21.3 %   MCV 85.3  78.0 - 100.0 fL   MCH 29.8  26.0 - 34.0 pg   MCHC 34.9  30.0 - 36.0 g/dL   RDW 08.6  57.8 - 46.9 %   Platelets 121 (*) 150 - 400 K/uL  COMPREHENSIVE METABOLIC PANEL      Result Value Range   Sodium 138  135 - 145 mEq/L   Potassium 4.1  3.5 - 5.1 mEq/L   Chloride 104  96 - 112 mEq/L   CO2 23  19 - 32 mEq/L   Glucose,  Bld 115 (*) 70 - 99 mg/dL   BUN 12  6 - 23 mg/dL   Creatinine, Ser 1.61  0.50 - 1.35 mg/dL   Calcium 8.6  8.4 - 09.6 mg/dL   Total Protein 6.3  6.0 - 8.3 g/dL   Albumin 3.5  3.5 - 5.2 g/dL   AST 16  0 - 37 U/L   ALT 9  0 - 53 U/L   Alkaline Phosphatase 51  39 - 117 U/L   Total Bilirubin 0.7  0.3 - 1.2 mg/dL   GFR calc non Af Amer 74 (*) >90 mL/min   GFR calc Af Amer 86 (*) >90 mL/min  PROTIME-INR      Result Value Range   Prothrombin Time 13.5  11.6 - 15.2 seconds   INR 1.04  0.00 - 1.49  TROPONIN I      Result Value Range   Troponin I <0.30  <0.30 ng/mL   Ct Head Wo Contrast  12/18/2012   *RADIOLOGY REPORT*  Clinical Data: Onset of slurred speech, repeating  himself, holding head in hands, history diabetes, hypercholesterolemia, unable to follow commands  CT HEAD WITHOUT CONTRAST  Technique:  Contiguous axial images were obtained from the base of the skull through the vertex without contrast.  Comparison: None  Findings: Generalized atrophy. Normal ventricular morphology. No midline shift or mass effect. Small vessel chronic ischemic changes of deep cerebral white matter. No intracranial hemorrhage, mass lesion or evidence of acute infarction. No extra-axial fluid collections. Bones and sinuses unremarkable. Extensive atherosclerotic calcifications at skull base.  IMPRESSION: Atrophy with small vessel chronic ischemic changes of deep cerebral white matter. No acute intracranial abnormalities.   Original Report Authenticated By: Ulyses Southward, M.D.      MDM  Iv ns. Labs. Ct.  Reviewed nursing notes and prior charts for additional history.   cbg normal.   Ct neg acute.   cxr and ua pending.  Given altered mental status, slurred speech/exp aphasia, pt will require admit.  hospitalist service called, will admit team 7, tele.        Suzi Roots, MD 12/18/12 [phone]

## 2012-12-18 NOTE — H&P (Signed)
[facility] History and Physical  [name] [name] ZOX:[id number] DOB: 28-Mar-1923 DOA: 12/18/2012  Referring physician: Dr [name] [name] PCP: No primary provider on file.  Specialists: None  Chief Complaint: change in mental status, slurred speech.   HPI: [name] [name] is a 79 y.o. male with PMH significant for Hypercholesterolemia, Diabetes, who was brought to ED by family due to slurred speech that started day prior of admission, and AMS. History obtain from chart and notes from ED physician.  Per family patient was non verbal the day prior to admission, occasional slurred speech. Patient has not been eating.  No family present during my evaluation. Patient is not following command, he does not understand questions.  He didn't know his name. He didn't recognize a pen when I ask what is this. He has slurred speech. When I ask, are you having pain ? He mention his name.   Review of Systems: Unable to obtain.   Past Medical History  Diagnosis Date  . Diabetes mellitus without complication   . Hypercholesteremia   . Arthritis    Past Surgical History  Procedure Laterality Date  . Back surgery     Social History: Patient unable to provide history.   Allergies  Allergen Reactions  . Celecoxib Other (See Comments)    Unknown reaction    Family History: unable to obtain.   Prior to Admission medications   Medication Sig Start Date End Date Taking? Authorizing Provider  ibuprofen (ADVIL,MOTRIN) 200 MG tablet Take 200 mg by mouth once.   Yes Historical Provider, MD  meloxicam (MOBIC) 7.5 MG tablet Take 7.5 mg by mouth daily.   Yes Historical Provider, MD  pioglitazone (ACTOS) 45 MG tablet Take 45 mg by mouth daily.   Yes Historical Provider, MD   Physical Exam: Filed Vitals:   12/18/12 1545 12/18/12 1600 12/18/12 1615 12/18/12 1630  BP: 158/71 172/66 168/71 152/69  Pulse: 52 55 64 53  Temp:      TempSrc:      Resp: 19 18 17 19   Height:      Weight:      SpO2: 96% 96% 97%  97%   General Appearance:    Alert, not following command, in no distress.   Head:    Normocephalic, without obvious abnormality, atraumatic  Eyes:    PERRL, conjunctiva/corneas clear         Ears:    Normal TM's and external ear canals, both ears  Nose:   Nares normal, septum midline, mucosa normal, no drainage    or sinus tenderness  Throat:   Lips, mucosa, and tongue normal; teeth and gums normal  Neck:   Supple, symmetrical, trachea midline, no adenopathy;       thyroid:  No enlargement/tenderness/nodules; no JVD  Back:     Symmetric, no curvature, ROM normal, no CVA tenderness  Lungs:     Clear to auscultation bilaterally, respirations unlabored     Heart:    Regular rate and rhythm, S1 and S2 normal, no murmur, rub   or gallop  Abdomen:     Soft, non-tender, bowel sounds active all four quadrants,    no masses, no organomegaly        Extremities:   Extremities normal, atraumatic, no cyanosis or edema  Pulses:   2+ and symmetric all extremities  Skin:   Multiple actinic Keratosis skin abdomen.      Neurologic:   Patient is not following command, expressive aphasia, dysarthria, able to move upper extremities,  right lower extremity flaccid. Able to hold uper left lower extremity.      Labs on Admission:  Basic Metabolic Panel:  Recent Labs Lab 12/18/12 1605  NA 138  K 4.1  CL 104  CO2 23  GLUCOSE 115*  BUN 12  CREATININE 0.90  CALCIUM 8.6   Liver Function Tests:  Recent Labs Lab 12/18/12 1605  AST 16  ALT 9  ALKPHOS 51  BILITOT 0.7  PROT 6.3  ALBUMIN 3.5   No results found for this basename: LIPASE, AMYLASE,  in the last 168 hours No results found for this basename: AMMONIA,  in the last 168 hours CBC:  Recent Labs Lab 12/18/12 1605  WBC 9.2  HGB 13.8  HCT 39.5  MCV 85.3  PLT 121*   Cardiac Enzymes:  Recent Labs Lab 12/18/12 1606  TROPONINI <0.30    BNP (last 3 results) No results found for this basename: PROBNP,  in the last 8760  hours CBG: No results found for this basename: GLUCAP,  in the last 168 hours  Radiological Exams on Admission: Ct Head Wo Contrast  12/18/2012   *RADIOLOGY REPORT*  Clinical Data: Onset of slurred speech, repeating himself, holding head in hands, history diabetes, hypercholesterolemia, unable to follow commands  CT HEAD WITHOUT CONTRAST  Technique:  Contiguous axial images were obtained from the base of the skull through the vertex without contrast.  Comparison: None  Findings: Generalized atrophy. Normal ventricular morphology. No midline shift or mass effect. Small vessel chronic ischemic changes of deep cerebral white matter. No intracranial hemorrhage, mass lesion or evidence of acute infarction. No extra-axial fluid collections. Bones and sinuses unremarkable. Extensive atherosclerotic calcifications at skull base.  IMPRESSION: Atrophy with small vessel chronic ischemic changes of deep cerebral white matter. No acute intracranial abnormalities.   Original Report Authenticated By: [name] [name], M.D.    EKG: Independently reviewed. Sinus rhythm, T wave inversion lead V 4 to V 6.   Assessment/Plan Active Problems:   DM   PULMONARY FIBROSIS ILD POST INFLAMMATORY CHRONIC   Aphasia  1-Stroke: Patient with aphasia and dysarthria. He had likely an Stroke. He presents out of period window for TPA. I will start aspirin. Admit to telemetry. Will order MRI brain. Work up for stroke. PT, OT , speech consult. MRI also to rule out metastatic disease to brain. Will do a work up for infection. Chest x ray, UA. CT head  negative for acute stroke.   2-HTN: no on any medication at home. Permissive HTN in case of possible stroke.  3-Diabetes: Hold oral hypoglycemic agents while NPO. Will order SSI.  4-Per records he had Skin Biopsy which show Basal Cell carcinoma. Unclear if family is aware of result. Will need to discussed result with family.  5-Pulmonary Fibrosis: Stable. Oxygen Sat 97 on RA>     Code  Status: no family available to discussed Code status.  Family Communication: none at bedside.  Disposition Plan: Expect 2 to 3 days.   Time spent: 65 minutes.   [name] [name] [facility] Pager [phone]  If 7PM-7AM, please contact night-coverage [email or web address] Password [facility] 12/18/2012, 6:46 PM

## 2012-12-18 NOTE — ED Notes (Signed)
Attempted to call report to [place], Charity fundraiser.

## 2012-12-18 NOTE — ED Notes (Signed)
Family offered and given drinks.

## 2012-12-18 NOTE — ED Notes (Signed)
Patient's room assignment changed to 4N.

## 2012-12-18 NOTE — ED Notes (Signed)
Attempted to call report to Benna Dunks, RN 4N.

## 2012-12-18 NOTE — Progress Notes (Signed)
Attempted to call Tresa Endo, RN back x3 starting at [employer]

## 2012-12-18 NOTE — ED Notes (Signed)
Patient arrived via [facility] with altered mental status. Patient's family stated last seen normal was Saturday. EMS states patient is unable to follow commands. EMS states he is not showing stroke symptoms.

## 2012-12-18 NOTE — ED Notes (Signed)
Family arrived to room. Wife states patient was laying on the couch and started to hold his head in his hands, speech was slurred. Patient is also repeating himself. Wife states LSN time was on Saturday night when he went to bed. Sunday he was not talking to anyone yesterday and today he has been confused and speech was not normal. Patient is unable to follow commands at this time.

## 2012-12-19 ENCOUNTER — Inpatient Hospital Stay (HOSPITAL_COMMUNITY): Payer: Medicare Other

## 2012-12-19 DIAGNOSIS — R4182 Altered mental status, unspecified: Secondary | ICD-10-CM | POA: Diagnosis not present

## 2012-12-19 DIAGNOSIS — R4701 Aphasia: Secondary | ICD-10-CM | POA: Diagnosis not present

## 2012-12-19 DIAGNOSIS — I658 Occlusion and stenosis of other precerebral arteries: Secondary | ICD-10-CM | POA: Diagnosis not present

## 2012-12-19 DIAGNOSIS — R1310 Dysphagia, unspecified: Secondary | ICD-10-CM | POA: Diagnosis not present

## 2012-12-19 DIAGNOSIS — I6350 Cerebral infarction due to unspecified occlusion or stenosis of unspecified cerebral artery: Secondary | ICD-10-CM | POA: Diagnosis not present

## 2012-12-19 DIAGNOSIS — E119 Type 2 diabetes mellitus without complications: Secondary | ICD-10-CM | POA: Diagnosis not present

## 2012-12-19 LAB — LIPID PANEL
Cholesterol: 129 mg/dL (ref 0–200)
HDL: 26 mg/dL — ABNORMAL LOW (ref >39)
LDL Cholesterol: 85 mg/dL (ref 0–99)
Total CHOL/HDL Ratio: 5 RATIO
Triglycerides: 91 mg/dL (ref <150)
VLDL: 18 mg/dL (ref 0–40)

## 2012-12-19 LAB — GLUCOSE, CAPILLARY
Glucose-Capillary: 82 mg/dL (ref 70–99)
Glucose-Capillary: 134 mg/dL — ABNORMAL HIGH (ref 70–99)
Glucose-Capillary: 110 mg/dL — ABNORMAL HIGH (ref 70–99)

## 2012-12-19 LAB — TROPONIN I
Troponin I: <0.3 ng/mL (ref <0.30)
Troponin I: <0.3 ng/mL (ref <0.30)

## 2012-12-19 LAB — HEMOGLOBIN A1C: Hgb A1c MFr Bld: 6 % — ABNORMAL HIGH (ref <5.7)

## 2012-12-19 MED ORDER — ASPIRIN EC 325 MG PO TBEC
325.0000 mg | DELAYED_RELEASE_TABLET | Freq: Every day | ORAL | Status: DC
  Filled 2012-12-19: qty 1

## 2012-12-19 MED ORDER — PANTOPRAZOLE SODIUM 40 MG PO TBEC
40.0000 mg | DELAYED_RELEASE_TABLET | Freq: Every day | ORAL | Status: DC
  Filled 2012-12-19 (×3): qty 1

## 2012-12-19 MED ORDER — ATORVASTATIN CALCIUM 20 MG PO TABS
20.0000 mg | ORAL_TABLET | Freq: Every day | ORAL | Status: DC
  Administered 2012-12-19 – 2012-12-21 (×3): 20 mg via ORAL
  Filled 2012-12-19 (×4): qty 1

## 2012-12-19 NOTE — Progress Notes (Signed)
Pt arrived to floor. ED RN had stated patient still needed bladder scan and I/O catheterization. RN had stated patient's penis was erect and the NT had not been able to catheterize at 1800 in the ED due to pain. When patient arrived to floor at 2230, penis was still erect. K. Schorr, NP notified. Patient's family was called to inquire if they had noticed this. Patient's wife stated this is not a new issue and he has been like this consistently for 10 years secondary to penile implant malfunction.

## 2012-12-19 NOTE — Progress Notes (Signed)
Pt advanced to Dys 3 diet with thin liq, crush meds in apple sauce per speech recommendations. Will continue to monitor pt for any signs of dysphagia

## 2012-12-19 NOTE — Evaluation (Signed)
Physical Therapy Evaluation Patient Details Name: [name] [name] MRN: [id number] DOB: February 05, 1923 Today's Date: 12/19/2012 Time: 6962-9528 PT Time Calculation (min): 58 min  PT Assessment / Plan / Recommendation Clinical Impression  79 y.o. male admitted to [facility] with HA and difficulty talking.  Stroke workup pending.  CT (-).  Pt presents with significant difficulty processing and expressing information.  At first I thought it was because he was so [facility], but it soon became evident with family's input that with he hearing aids in he could carry on normal conversations with people.  Physically, he is doing well with no significant signs of differences left to right.  I would like to see him walk with his normal assistive device, a single point cane, to assess if he is at baseline.  With his history of falls and leg weakness he would benefit from OP PT at discharge.      PT Assessment  Patient needs continued PT services    Follow Up Recommendations  Outpatient PT;Supervision/Assistance - 24 hour    Does the patient have the potential to tolerate intense rehabilitation     NA  Barriers to Discharge None None    Equipment Recommendations  None recommended by PT    Recommendations for Other Services   none  Frequency Min 4X/week    Precautions / Restrictions Precautions Precautions: Fall Precaution Comments: h/o falls inside and outside at home, not very frequent.    Pertinent Vitals/Pain See vitals flow sheet.       Mobility  Bed Mobility Bed Mobility: Supine to Sit;Sitting - Scoot to Edge of Bed;Sit to Supine Supine to Sit: 4: Min assist;With rails;HOB elevated Sitting - Scoot to Edge of Bed: 4: Min guard;With rail Sit to Supine: 4: Min guard;With rail;HOB flat Details for Bed Mobility Assistance: min tactile cues to get message across that I wanted him to sit up.  At first I thought that he couldn't hear me, but as I continued to work with him I think that he is having trouble  processing information.   Transfers Transfers: Sit to Stand;Stand to Sit Sit to Stand: 4: Min guard;With upper extremity assist;From bed Stand to Sit: 4: Min guard;With upper extremity assist;With armrests;To bed Details for Transfer Assistance: min guard assist to steady pt for balance. Ambulation/Gait Ambulation/Gait Assistance: 4: Min guard Ambulation Distance (Feet): 150 Feet Assistive device: Rolling walker Ambulation/Gait Assistance Details: min guard assist to help steer RW and for mild LOB while turning.   Gait Pattern: Step-through pattern;Shuffle;Trunk flexed General Gait Details: pt not used to using RW, needed min assist for steering.   Modified Rankin (Stroke Patients Only) Pre-Morbid Rankin Score: No symptoms Modified Rankin: Moderately severe disability    Exercises Other Exercises Other Exercises: spoke at length with family re: signs and symptoms of stroke, importance of getting to the hospital quickly and now that he has had a stroke that he is at higher risk of stroke.     PT Diagnosis: Difficulty walking;Abnormality of gait;Generalized weakness;Altered mental status  PT Problem List: Decreased strength;Decreased activity tolerance;Decreased balance;Decreased mobility;Decreased cognition;Decreased knowledge of use of DME PT Treatment Interventions: DME instruction;Gait training;Stair training;Functional mobility training;Therapeutic activities;Therapeutic exercise;Balance training;Neuromuscular re-education;Cognitive remediation;Patient/family education   PT Goals Acute Rehab PT Goals PT Goal Formulation: With family Time For Goal Achievement: 12/26/12 Potential to Achieve Goals: Good Pt will go Supine/Side to Sit: with modified independence;with HOB 0 degrees PT Goal: Supine/Side to Sit - Progress: Goal set today Pt will go Sit to Supine/Side:  with modified independence;with HOB 0 degrees PT Goal: Sit to Supine/Side - Progress: Goal set today Pt will go Sit to  Stand: with supervision PT Goal: Sit to Stand - Progress: Goal set today Pt will go Stand to Sit: with supervision PT Goal: Stand to Sit - Progress: Goal set today Pt will Ambulate: >150 feet;with supervision;with cane PT Goal: Ambulate - Progress: Goal set today Pt will Go Up / Down Stairs: 3-5 stairs;with supervision;with rail(s);with least restrictive assistive device PT Goal: Up/Down Stairs - Progress: Goal set today  Visit Information  Last PT Received On: 12/19/12 Assistance Needed: +1    Subjective Data  Subjective: Pt having difficulty processing commands and expressing wishes.   Patient Stated Goal: Family would like for him to return to normal.  "Think and talk clearly" per wife.     Prior Functioning  Home Living Lives With: Spouse;Son Available Help at Discharge: Family;Available 24 hours/day Type of Home: House Home Access: Stairs to enter [employer] of Steps: 4 Entrance Stairs-Rails: Right;Left;Can reach both Home Layout: One level Home Adaptive Equipment: Straight cane;Walker - rolling Additional Comments: PTA pt walked both inside and outside with cane Prior Function Level of Independence: Independent with assistive device(s) Able to Take Stairs?: Yes Driving: Yes Vocation: Retired Musician: Receptive difficulties;Expressive difficulties;HOH Dominant Hand: Right    Cognition  Cognition Arousal/Alertness: Awake/alert Behavior During Therapy: WFL for tasks assessed/performed Overall Cognitive Status: Impaired/Different from baseline Area of Impairment: Following commands;Problem solving Following Commands: Follows one step commands inconsistently Problem Solving: Slow processing;Difficulty sequencing;Requires verbal cues;Requires tactile cues (visual cues needed as well.  )    Extremity/Trunk Assessment Right Lower Extremity Assessment RLE ROM/Strength/Tone: Deficits RLE ROM/Strength/Tone Deficits: generalized weakness knees  stay in slight flexion, son reports arthritic knees bil.   Left Lower Extremity Assessment LLE ROM/Strength/Tone: Deficits LLE ROM/Strength/Tone Deficits: generalized weakness knees stay in slight flexion, son reports arthritic knees bil.     Balance Balance Balance Assessed: Yes Static Sitting Balance Static Sitting - Balance Support: Bilateral upper extremity supported;Feet supported Static Sitting - Level of Assistance: 5: Stand by assistance Static Standing Balance Static Standing - Balance Support: Bilateral upper extremity supported Static Standing - Level of Assistance: 4: Min assist  End of Session PT - End of Session Equipment Utilized During Treatment: Gait belt Activity Tolerance: Patient tolerated treatment well Patient left: in bed;with call bell/phone within reach;with family/visitor present Nurse Communication: Mobility status    Mando Blatz B. Karlina Suares, PT, DPT [phone]   12/19/2012, 2:35 PM

## 2012-12-19 NOTE — Progress Notes (Signed)
ED RN reported patient too lethargic in ED to perform RN bedside swallow eval. Patient unable to follow commands upon arrival to floor, disoriented completely. Stroke swallow screen not completed and patient made NPO and SLP bedside eval ordered.

## 2012-12-19 NOTE — Progress Notes (Signed)
Subjective: Admission history and physical reviewed, case discussed with family. Apparently patient had acute mental status change on Sunday when he was lethargic Slept a lot and appeared to have aphasia. In the ED he was evaluated labs fairly unremarkable CT without acute abnormality. MRI is pending. Patient will open his eyes he will track with his eyes however he will not follow any commands. He's been nonverbal during my examination. The wife state he gave walker earlier with physical therapy. There are no new medications. He did ultimately pass a swallow evaluation  Objective: Vital signs in last 24 hours: Temp:  [97.2 F (36.2 C)-98.2 F (36.8 C)] 97.2 F (36.2 C) (05/20 1453) Pulse Rate:  [48-67] 55 (05/20 1453) Resp:  [12-20] 20 (05/20 1453) BP: (127-172)/(59-99) 169/68 mmHg (05/20 1453) SpO2:  [95 %-99 %] 99 % (05/20 1453) Weight:  [93.3 kg (205 lb 11 oz)-95.255 kg (210 lb)] 93.3 kg (205 lb 11 oz) (05/20 0000) Weight change:  Last BM Date:  (PTA)  Intake/Output from previous day: 05/19 0701 - 05/20 0700 In: -  Out: 500 [Urine:500] Intake/Output this shift: Total I/O In: -  Out: 250 [Urine:250]  General appearance: patient is in no apparent distress Resp: clear to auscultation bilaterally Cardio: regular rate and rhythm Extremities: extremities normal, atraumatic, no cyanosis or edema patient will not follow any commands however he moves all extremities without any problem.  Lab Results:  Results for orders placed during the hospital encounter of 12/18/12 (from the past 24 hour(s))  CBC     Status: Abnormal   Collection Time    12/18/12  4:05 PM      Result Value Range   WBC 9.2  4.0 - 10.5 K/uL   RBC 4.63  4.22 - 5.81 MIL/uL   Hemoglobin 13.8  13.0 - 17.0 g/dL   HCT 16.1  09.6 - 04.5 %   MCV 85.3  78.0 - 100.0 fL   MCH 29.8  26.0 - 34.0 pg   MCHC 34.9  30.0 - 36.0 g/dL   RDW 40.9  81.1 - 91.4 %   Platelets 121 (*) 150 - 400 K/uL  COMPREHENSIVE METABOLIC PANEL      Status: Abnormal   Collection Time    12/18/12  4:05 PM      Result Value Range   Sodium 138  135 - 145 mEq/L   Potassium 4.1  3.5 - 5.1 mEq/L   Chloride 104  96 - 112 mEq/L   CO2 23  19 - 32 mEq/L   Glucose, Bld 115 (*) 70 - 99 mg/dL   BUN 12  6 - 23 mg/dL   Creatinine, Ser 7.82  0.50 - 1.35 mg/dL   Calcium 8.6  8.4 - 95.6 mg/dL   Total Protein 6.3  6.0 - 8.3 g/dL   Albumin 3.5  3.5 - 5.2 g/dL   AST 16  0 - 37 U/L   ALT 9  0 - 53 U/L   Alkaline Phosphatase 51  39 - 117 U/L   Total Bilirubin 0.7  0.3 - 1.2 mg/dL   GFR calc non Af Amer 74 (*) >90 mL/min   GFR calc Af Amer 86 (*) >90 mL/min  PROTIME-INR     Status: None   Collection Time    12/18/12  4:05 PM      Result Value Range   Prothrombin Time 13.5  11.6 - 15.2 seconds   INR 1.04  0.00 - 1.49  TROPONIN I  Status: None   Collection Time    12/18/12  4:06 PM      Result Value Range   Troponin I <0.30  <0.30 ng/mL  TROPONIN I     Status: None   Collection Time    12/18/12  8:19 PM      Result Value Range   Troponin I <0.30  <0.30 ng/mL  URINALYSIS, ROUTINE W REFLEX MICROSCOPIC     Status: Abnormal   Collection Time    12/18/12 10:51 PM      Result Value Range   Color, Urine YELLOW  YELLOW   APPearance CLEAR  CLEAR   Specific Gravity, Urine 1.020  1.005 - 1.030   pH 6.5  5.0 - 8.0   Glucose, UA NEGATIVE  NEGATIVE mg/dL   Hgb urine dipstick NEGATIVE  NEGATIVE   Bilirubin Urine NEGATIVE  NEGATIVE   Ketones, ur 40 (*) NEGATIVE mg/dL   Protein, ur NEGATIVE  NEGATIVE mg/dL   Urobilinogen, UA 1.0  0.0 - 1.0 mg/dL   Nitrite NEGATIVE  NEGATIVE   Leukocytes, UA NEGATIVE  NEGATIVE  CBC     Status: Abnormal   Collection Time    12/18/12 11:06 PM      Result Value Range   WBC 9.2  4.0 - 10.5 K/uL   RBC 4.65  4.22 - 5.81 MIL/uL   Hemoglobin 13.9  13.0 - 17.0 g/dL   HCT 40.9  81.1 - 91.4 %   MCV 84.7  78.0 - 100.0 fL   MCH 29.9  26.0 - 34.0 pg   MCHC 35.3  30.0 - 36.0 g/dL   RDW 78.2  95.6 - 21.3 %   Platelets  126 (*) 150 - 400 K/uL  CREATININE, SERUM     Status: Abnormal   Collection Time    12/18/12 11:06 PM      Result Value Range   Creatinine, Ser 0.84  0.50 - 1.35 mg/dL   GFR calc non Af Amer 76 (*) >90 mL/min   GFR calc Af Amer 88 (*) >90 mL/min  TROPONIN I     Status: None   Collection Time    12/19/12  1:27 AM      Result Value Range   Troponin I <0.30  <0.30 ng/mL  LIPID PANEL     Status: Abnormal   Collection Time    12/19/12  1:27 AM      Result Value Range   Cholesterol 129  0 - 200 mg/dL   Triglycerides 91  <086 mg/dL   HDL 26 (*) >57 mg/dL   Total CHOL/HDL Ratio 5.0     VLDL 18  0 - 40 mg/dL   LDL Cholesterol 85  0 - 99 mg/dL  GLUCOSE, CAPILLARY     Status: None   Collection Time    12/19/12  6:25 AM      Result Value Range   Glucose-Capillary 82  70 - 99 mg/dL   Comment 1 Documented in Chart     Comment 2 Notify RN    TROPONIN I     Status: None   Collection Time    12/19/12  9:30 AM      Result Value Range   Troponin I <0.30  <0.30 ng/mL  GLUCOSE, CAPILLARY     Status: Abnormal   Collection Time    12/19/12 11:23 AM      Result Value Range   Glucose-Capillary 134 (*) 70 - 99 mg/dL   Comment 1 Documented in  Chart     Comment 2 Notify RN        Studies/Results: Dg Chest 2 View  12/18/2012   *RADIOLOGY REPORT*  Clinical Data: Chest pain and stroke symptoms.  CHEST - 2 VIEW  Comparison: 09/04/2009.  Findings: Very poor inspiration.  Borderline enlarged cardiac silhouette.  Clear lungs.  No significant change in mild prominence of the pulmonary interstitial markings.  Thoracic spine degenerative changes.  IMPRESSION: No acute abnormality.  Very poor inspiration with grossly stable mild chronic interstitial lung disease.   Original Report Authenticated By: Beckie Salts, M.D.   Ct Head Wo Contrast  12/18/2012   *RADIOLOGY REPORT*  Clinical Data: Onset of slurred speech, repeating himself, holding head in hands, history diabetes, hypercholesterolemia, unable to  follow commands  CT HEAD WITHOUT CONTRAST  Technique:  Contiguous axial images were obtained from the base of the skull through the vertex without contrast.  Comparison: None  Findings: Generalized atrophy. Normal ventricular morphology. No midline shift or mass effect. Small vessel chronic ischemic changes of deep cerebral white matter. No intracranial hemorrhage, mass lesion or evidence of acute infarction. No extra-axial fluid collections. Bones and sinuses unremarkable. Extensive atherosclerotic calcifications at skull base.  IMPRESSION: Atrophy with small vessel chronic ischemic changes of deep cerebral white matter. No acute intracranial abnormalities.   Original Report Authenticated By: Ulyses Southward, M.D.    Medications: I will Prior to Admission:  Prescriptions prior to admission  Medication Sig Dispense Refill  . ibuprofen (ADVIL,MOTRIN) 200 MG tablet Take 200 mg by mouth once.      . meloxicam (MOBIC) 7.5 MG tablet Take 7.5 mg by mouth daily.      . pioglitazone (ACTOS) 45 MG tablet Take 45 mg by mouth daily.       Scheduled: . aspirin  300 mg Rectal Daily  . enoxaparin (LOVENOX) injection  30 mg Subcutaneous Q24H   Continuous: . sodium chloride 50 mL/hr at 12/18/12 2307    Assessment/Plan: Mental status change characterized by transient slurred speech per family, aphasia, rule out CVA, CT negative, MRI pending.continue aspirin, Neurology input will be requested Diabetes, resume oral hypoglycemics when adequate by mouth intake is established, previous A1c was 6.1 in March Hypercholesterolemia , resume statin  LOS: 1 day   Denica Web D 12/19/2012, 3:37 PM

## 2012-12-19 NOTE — Evaluation (Signed)
Clinical/Bedside Swallow Evaluation Patient Details  Name: [name] [name] MRN: [id number] Date of Birth: 12/08/1922  Today's Date: 12/19/2012 Time: 8119-1478 SLP Time Calculation (min): 22 min  Past Medical History:  Past Medical History  Diagnosis Date  . Diabetes mellitus without complication   . Hypercholesteremia   . Arthritis    Past Surgical History:  Past Surgical History  Procedure Laterality Date  . Back surgery     HPI:  79 yo male adm to [facility] with AMS, family noted pt on sofa holding his head with his hands- non verbal, not following commands,  and not eating, with right sided weakness (lower leg).  PMH + for DM, cervical osteophytes at C6-C7 per MRI 01/28/06.  CXR 5/19 negative, poor inspiration.  Pt CT head negative for acute change.  Pt failed RN stroke swallow screen and evaluation was ordered.     Assessment / Plan / Recommendation Clinical Impression  Pt presents with clinical indicators of mild oral and possibly pharyngeal dysphagia.  Please note pt has h/o nonspecific esophageal motility d/o per barium swallow in 2012, therefore esophageal precautions indicated.  Pt self fed today (but did not follow commands) , observed pt consuming graham crackers, applesauce, nectar juice and thin water.  Delayed oral transit and suspect delayed pharyngeal swallow noted but no s/s of asp when consuming snack.  Overt cough x1 noted when pt was trying to rinse with water after dental care - suspect aspiration.  Rec pt has assist to brush his teeth and use swabs to clear oral cavity of toothpaste.   Rec soft/ground meats/thin assuring pt is allowed to self feed.  Therapeutic intervention included determining dietary consistencies recommended and aiding pt to conduct oral care.  SLP to follow for dysphagia, aphasia, no family present at this time.  Thanks for this consult.      Aspiration Risk  Mild    Diet Recommendation Dysphagia 3 (Mechanical Soft);Thin liquid   Liquid  Administration via: Straw;Cup Medication Administration: Crushed with puree (whole if small, follow w/water) Supervision: Intermittent supervision to cue for compensatory strategies;Patient able to self feed Compensations: Slow rate;Small sips/bites;Check for pocketing Postural Changes and/or Swallow Maneuvers: Seated upright 90 degrees;Upright 30-60 min after meal    Other  Recommendations Recommended Consults: OT self-feeding Oral Care Recommendations: Oral care QID   Follow Up Recommendations   (TBD)    Frequency and Duration    2 weeks   Pertinent Vitals/Pain Afebrile, decreased    SLP Swallow Goals Patient will utilize recommended strategies during swallow to increase swallowing safety with: Minimal cueing   Swallow Study Prior Functional Status   unknown    General Date of Onset: 12/19/12 HPI: 79 yo male adm to [facility] with AMS, family noted pt on sofa holding his head with his hands- non verbal, not following commands,  and not eating, with right sided weakness (lower leg).  PMH + for DM, cervical osteophytes at C6-C7 per MRI 01/28/06.  CXR 5/19 negative, poor inspiration.  Pt CT head negative for acute change.  Pt failed RN stroke swallow screen and evaluation was ordered.   Type of Study: Bedside swallow evaluation Previous Swallow Assessment: MBS 06/12/2010 results not available, esophagram 08/05/10 prominent CP, nonspecific esophageal dysmotility disorder, tiny cervical web not obstructive.   Diet Prior to this Study: NPO Temperature Spikes Noted: No Respiratory Status: Room air History of Recent Intubation: No Behavior/Cognition: Alert;Hard of hearing;Decreased sustained attention;Doesn't follow directions;Requires cueing Oral Cavity - Dentition: Dentures, top;Poor condition (poor condition of  lowers) Self-Feeding Abilities: Able to feed self (self fed with right hand) Patient Positioning: Upright in bed Baseline Vocal Quality: Clear Volitional Cough: Cognitively unable to  elicit Volitional Swallow: Unable to elicit    Oral/Motor/Sensory Function Overall Oral Motor/Sensory Function: Appears within functional limits for tasks assessed (? right facial asymmetry, mild, pt did not follow commands) Lingual Strength:  (able to seal lips on spoon, straw) Facial Symmetry: Right droop;Right drooping eyelid (? right drooping eye) Velum:  (pt did not open mouth to view) Mandible:  (able to masticate)   Ice Chips Ice chips: Not tested Other Comments: pt declined to accept ice chip bolus   Thin Liquid Thin Liquid: Impaired Presentation: Self Fed;Straw;Cup Oral Phase Impairments: Impaired anterior to posterior transit;Reduced lingual movement/coordination Oral Phase Functional Implications: Prolonged oral transit Pharyngeal  Phase Impairments: Suspected delayed Swallow Other Comments: cough x1 when attempting to swish and expectorate water after teeth brushing, suspect aspiration, no episodes when drinking on own, delayed up to four seconds    Nectar Thick Nectar Thick Liquid: Impaired Presentation: Cup;Self Fed;Straw Oral Phase Impairments: Reduced lingual movement/coordination;Impaired anterior to posterior transit Oral phase functional implications: Prolonged oral transit Pharyngeal Phase Impairments: Suspected delayed Swallow   Honey Thick Honey Thick Liquid: Not tested   Puree Puree: Impaired Presentation: Self Fed;Spoon Oral Phase Impairments: Reduced lingual movement/coordination;Impaired anterior to posterior transit Oral Phase Functional Implications: Prolonged oral transit Pharyngeal Phase Impairments: Suspected delayed Swallow   Solid   GO    Solid: Within functional limits Presentation: Self Fed Other Comments: pt slow eater, aids airway protection       Donavan Burnet, MS [facility] SLP [phone]

## 2012-12-19 NOTE — Progress Notes (Signed)
VASCULAR LAB PRELIMINARY  PRELIMINARY  PRELIMINARY  PRELIMINARY  Carotid duplex completed.    Preliminary report:  Bilateral:  No evidence of hemodynamically significant internal carotid artery stenosis.   Vertebral artery flow is antegrade.     Jules Vidovich, RVS 12/19/2012, 5:08 PM

## 2012-12-19 NOTE — Progress Notes (Signed)
Patient has not voided since I/O cath last night at 2300. Bladder scan reveals . Will pass along to day RN to monitor.

## 2012-12-19 NOTE — Progress Notes (Signed)
I/O cath performed per order to obtain urine specimen. Condom catheter applied afterwards as patient is confused and nursing staff unaware of if he is continent or not.

## 2012-12-19 NOTE — Evaluation (Signed)
Speech Language Pathology Evaluation Patient Details Name: [name] [name] MRN: [id number] DOB: May 11, 1923 Today's Date: 12/19/2012 Time: 0913-0922 SLP Time Calculation (min): 9 min  Problem List:  Patient Active Problem List   Diagnosis Date Noted  . Aphasia 12/18/2012  . COUGH 09/04/2009  . PULMONARY FIBROSIS ILD POST INFLAMMATORY CHRONIC 07/19/2007  . DM 06/21/2007   Past Medical History:  Past Medical History  Diagnosis Date  . Diabetes mellitus without complication   . Hypercholesteremia   . Arthritis    Past Surgical History:  Past Surgical History  Procedure Laterality Date  . Back surgery     HPI:  79 yo male adm to [facility] with AMS, family noted pt on sofa holding his head with his hands- non verbal, not following commands,  and not eating, with right sided weakness (lower leg).  PMH + for DM, cervical osteophytes at C6-C7 per MRI 01/28/06.  CXR 5/19 negative, poor inspiration.  Pt CT head negative for acute change.  Pt failed RN stroke swallow screen and evaluation was ordered.     Assessment / Plan / Recommendation Clinical Impression  Pt presents with severe language deficits impacting both expressive and receptive abilities.  Suspect significant hearing loss exacerbating deficits.  Unforunately family not present to help establish baseline status.  Pt did not follow one step commands but would mimic approx 50% of opportunities.  Stereotypical utterances noted, "Why?" "What's that for?"  and "Thank you".  Pt did name a pen that was sitting in front of him (without instruction) but did not name further objects.  Verbal apraxia possible given pt production, groping for words with inconsisent errors and islands of fluent words.  Rec skilled SLP to maximize cognitive- language function to decrease caregiver burden and maximize pt's participation in his medical care.      SLP Assessment  Patient needs continued Speech Lanaguage Pathology Services    Follow Up Recommendations    (TBD)    Frequency and Duration min 2x/week  2 weeks   Pertinent Vitals/Pain Afebrile,decreased   SLP Goals  SLP Goals Potential to Achieve Goals: Fair Potential Considerations: Ability to learn/carryover information;Severity of impairments SLP Goal #1: Pt will follow one step commands with 80% accuracy during functional tasks with max assist.  SLP Goal #2: Pt will identify objects used daily from choice of two with 80% accuracy and max assist.  SLP Goal #3: Pt will participate in automatic speech tasks with 80% accuracy and max assist to maximize expressive language rehabiliation.  SLP Goal #4: Pt will answer basic y/n questions re: current environment with 80% accuracy and total assist.   SLP Evaluation Prior Functioning  Cognitive/Linguistic Baseline: Information not available (family not present)   Cognition  Overall Cognitive Status: Difficult to assess (Pt VERY HOH, language deficits impact evaluation) Orientation Level: Disoriented X4 Attention: Focused;Sustained;Selective Focused Attention: Appears intact Sustained Attention: Appears intact Sustained Attention Impairment: Functional basic;Verbal basic Selective Attention: Impaired Selective Attention Impairment: Verbal basic;Functional basic Awareness: Impaired Awareness Impairment: Emergent impairment (pt showed frustration with verbalization attempts) Problem Solving: Impaired Problem Solving Impairment: Functional basic Safety/Judgment: Impaired    Comprehension  Auditory Comprehension Overall Auditory Comprehension: Impaired Yes/No Questions: Not tested Commands: Impaired One Step Basic Commands: 25-49% accurate Interfering Components: Hearing Visual Recognition/Discrimination Discrimination: Exceptions to [facility] Common Objects: Unable to indentify (in field of two) Reading Comprehension Reading Status: Impaired (pt did not point to his name with choice of 2) Word level: Not tested Sentence Level: Not  tested Paragraph Level: Not  tested Functional Environmental (signs, name badge): Not tested Interfering Components: Visual acuity;Visual perceptual (? if pt uses glasses?)    Expression Expression Primary Mode of Expression: Verbal Verbal Expression Overall Verbal Expression: Impaired Automatic Speech: Social Response (pt said thank you, bye, did not count,sing) Level of Generative/Spontaneous Verbalization: Word;Phrase (attempts at word, phrase level) Repetition: Impaired Level of Impairment: Word level Naming: Impairment Responsive: Not tested Confrontation: Impaired Common Objects: Unable to indentify (in field of two) Other Naming Comments: pt stated "What's that pen for" when it was placed in front of him, he did not name other objects Verbal Errors: Jargon;Not aware of errors;Aware of errors;Phonemic paraphasias (stereotypical utterances noted, "Why?" "What's that for?") Pragmatics: Impairment Impairments: Abnormal affect;Dysprosody Interfering Components: Attention Non-Verbal Means of Communication: Not applicable Written Expression Dominant Hand: Right Written Expression: Not tested   Oral / Motor Oral Motor/Sensory Function Overall Oral Motor/Sensory Function: Appears within functional limits for tasks assessed (? right facial asymmetry, mild, pt did not follow commands) Lingual Strength:  (able to seal lips on spoon, straw) Facial Symmetry: Right droop;Right drooping eyelid (? right drooping eye) Velum:  (pt did not open mouth to view) Mandible:  (able to masticate) Motor Speech Overall Motor Speech: Appears within functional limits for tasks assessed Motor Planning: Impaired Level of Impairment: Word Motor Speech Errors: Groping for words   GO     Donavan Burnet, MS [facility] SLP [phone]

## 2012-12-20 DIAGNOSIS — R4182 Altered mental status, unspecified: Secondary | ICD-10-CM | POA: Diagnosis not present

## 2012-12-20 DIAGNOSIS — R1310 Dysphagia, unspecified: Secondary | ICD-10-CM | POA: Diagnosis not present

## 2012-12-20 DIAGNOSIS — E119 Type 2 diabetes mellitus without complications: Secondary | ICD-10-CM | POA: Diagnosis not present

## 2012-12-20 DIAGNOSIS — R4701 Aphasia: Secondary | ICD-10-CM | POA: Diagnosis not present

## 2012-12-20 LAB — GLUCOSE, CAPILLARY
Glucose-Capillary: 104 mg/dL — ABNORMAL HIGH (ref 70–99)
Glucose-Capillary: 93 mg/dL (ref 70–99)
Glucose-Capillary: 131 mg/dL — ABNORMAL HIGH (ref 70–99)

## 2012-12-20 MED ORDER — CLOPIDOGREL BISULFATE 75 MG PO TABS
75.0000 mg | ORAL_TABLET | Freq: Every day | ORAL | Status: DC
  Administered 2012-12-21 – 2012-12-22 (×2): 75 mg via ORAL
  Filled 2012-12-20 (×4): qty 1

## 2012-12-20 NOTE — Consult Note (Signed)
Referring Physician: Dr. Nehemiah Settle    Chief Complaint: aphasia, stroke  HPI:                                                                                                                                         [name] [name] is an 79 y.o. male, right handed, with a past medical history significant for DM, hypercholesterolemia, admitted to [facility] with new altered mental status and difficulty speaking. He is described by his family as a very active and functional individual who was in his usual state of health until last Sunday when he became " kind of confused, holding his head with his hands, and not able to comprehend and speak properly".  However, wife stated that at times he is able to speak clearly but not long sentences. According to his wife he is " little bit forgetful and at times can get upset easily" but otherwise they report no significant changes in his condition. CT brain showed no acute intracranial abnormality but MRI-DWI demonstrated a very small acute left occipital infarct. MRA brain significant for high grade stenosis P2  segment bilateral PCA's. Currently on atorvastatin and clopidogrel.    Date last known well: 12/18/12 Time last known well: uncertain tPA Given:  No, out of the window for thrombolytics.  Past Medical History  Diagnosis Date  . Diabetes mellitus without complication   . Hypercholesteremia   . Arthritis     Past Surgical History  Procedure Laterality Date  . Back surgery      History reviewed. No pertinent family history. Social History:  reports that he has never smoked. He does not have any smokeless tobacco history on file. He reports that he does not drink alcohol or use illicit drugs.  Allergies:  Allergies  Allergen Reactions  . Celecoxib Other (See Comments)    Unknown reaction    Medications:                                                                                                                           I have reviewed the  patient's current medications.  ROS:   Unable to obtain due to patient's mental status and language dysfunction.  History obtained from chart review and family.   Physical exam: pleasant male in no apparent distress. Blood pressure 151/71, pulse 58, temperature 98.1 F (36.7 C), temperature source Oral, resp. rate 18, height 5\' 10"  (1.778 m), weight 93.3 kg (205 lb 11 oz), SpO2 96.00%. Head: normocephalic. Neck: supple, no bruits, no JVD. Cardiac: no murmurs. Lungs: clear. Abdomen: soft, no tender, no mass. Extremities: no edema.   Neurologic Examination:                                                                                                      Mental Status: Alert and awake. Disoriented to place, year, situation.  His comprehension of spoken language seems top be impaired and he is not able to speak fluently at the time of this assessment.  Cranial Nerves: II: Discs flat bilaterally; Visual fields grossly normal, pupils equal, round, reactive to light and accommodation III,IV, VI: ptosis not present, extra-ocular motions intact bilaterally V,VII: smile symmetric, facial light touch sensation normal bilaterally VIII: hearing normal bilaterally IX,X: gag reflex present XI: bilateral shoulder shrug XII: midline tongue extension Motor: Right : Upper extremity   5/5    Left:     Upper extremity   5/5  Lower extremity   5/5     Lower extremity   5/5 Tone and bulk:normal tone throughout; no atrophy noted Sensory: Pinprick and light touch intact throughout, bilaterally Deep Tendon Reflexes: 2+ and symmetric throughout Plantars: Right: downgoing   Left: downgoing Cerebellar: Unable to perform as he doesn't follow commands properly. Gait:  No tested. CV: pulses palpable throughout    Results for orders placed during the hospital encounter of  12/18/12 (from the past 48 hour(s))  CBC     Status: Abnormal   Collection Time    12/18/12  4:05 PM      Result Value Range   WBC 9.2  4.0 - 10.5 K/uL   RBC 4.63  4.22 - 5.81 MIL/uL   Hemoglobin 13.8  13.0 - 17.0 g/dL   HCT 16.1  09.6 - 04.5 %   MCV 85.3  78.0 - 100.0 fL   MCH 29.8  26.0 - 34.0 pg   MCHC 34.9  30.0 - 36.0 g/dL   RDW 40.9  81.1 - 91.4 %   Platelets 121 (*) 150 - 400 K/uL  COMPREHENSIVE METABOLIC PANEL     Status: Abnormal   Collection Time    12/18/12  4:05 PM      Result Value Range   Sodium 138  135 - 145 mEq/L   Potassium 4.1  3.5 - 5.1 mEq/L   Chloride 104  96 - 112 mEq/L   CO2 23  19 - 32 mEq/L   Glucose, Bld 115 (*) 70 - 99 mg/dL   BUN 12  6 - 23 mg/dL   Creatinine, Ser 7.82  0.50 - 1.35 mg/dL   Calcium 8.6  8.4 - 95.6 mg/dL   Total Protein 6.3  6.0 - 8.3 g/dL   Albumin 3.5  3.5 - 5.2 g/dL   AST 16  0 -  37 U/L   ALT 9  0 - 53 U/L   Alkaline Phosphatase 51  39 - 117 U/L   Total Bilirubin 0.7  0.3 - 1.2 mg/dL   GFR calc non Af Amer 74 (*) >90 mL/min   GFR calc Af Amer 86 (*) >90 mL/min   Comment:            The eGFR has been calculated     using the CKD EPI equation.     This calculation has not been     validated in all clinical     situations.     eGFR's persistently     <90 mL/min signify     possible Chronic Kidney Disease.  PROTIME-INR     Status: None   Collection Time    12/18/12  4:05 PM      Result Value Range   Prothrombin Time 13.5  11.6 - 15.2 seconds   INR 1.04  0.00 - 1.49  TROPONIN I     Status: None   Collection Time    12/18/12  4:06 PM      Result Value Range   Troponin I <0.30  <0.30 ng/mL   Comment:            Due to the release kinetics of cTnI,     a negative result within the first hours     of the onset of symptoms does not rule out     myocardial infarction with certainty.     If myocardial infarction is still suspected,     repeat the test at appropriate intervals.  TROPONIN I     Status: None   Collection  Time    12/18/12  8:19 PM      Result Value Range   Troponin I <0.30  <0.30 ng/mL   Comment:            Due to the release kinetics of cTnI,     a negative result within the first hours     of the onset of symptoms does not rule out     myocardial infarction with certainty.     If myocardial infarction is still suspected,     repeat the test at appropriate intervals.  URINALYSIS, ROUTINE W REFLEX MICROSCOPIC     Status: Abnormal   Collection Time    12/18/12 10:51 PM      Result Value Range   Color, Urine YELLOW  YELLOW   APPearance CLEAR  CLEAR   Specific Gravity, Urine 1.020  1.005 - 1.030   pH 6.5  5.0 - 8.0   Glucose, UA NEGATIVE  NEGATIVE mg/dL   Hgb urine dipstick NEGATIVE  NEGATIVE   Bilirubin Urine NEGATIVE  NEGATIVE   Ketones, ur 40 (*) NEGATIVE mg/dL   Protein, ur NEGATIVE  NEGATIVE mg/dL   Urobilinogen, UA 1.0  0.0 - 1.0 mg/dL   Nitrite NEGATIVE  NEGATIVE   Leukocytes, UA NEGATIVE  NEGATIVE   Comment: MICROSCOPIC NOT DONE ON URINES WITH NEGATIVE PROTEIN, BLOOD, LEUKOCYTES, NITRITE, OR GLUCOSE <1000 mg/dL.  CBC     Status: Abnormal   Collection Time    12/18/12 11:06 PM      Result Value Range   WBC 9.2  4.0 - 10.5 K/uL   RBC 4.65  4.22 - 5.81 MIL/uL   Hemoglobin 13.9  13.0 - 17.0 g/dL   HCT 16.1  09.6 - 04.5 %   MCV 84.7  78.0 - 100.0 fL  MCH 29.9  26.0 - 34.0 pg   MCHC 35.3  30.0 - 36.0 g/dL   RDW 16.1  09.6 - 04.5 %   Platelets 126 (*) 150 - 400 K/uL  CREATININE, SERUM     Status: Abnormal   Collection Time    12/18/12 11:06 PM      Result Value Range   Creatinine, Ser 0.84  0.50 - 1.35 mg/dL   GFR calc non Af Amer 76 (*) >90 mL/min   GFR calc Af Amer 88 (*) >90 mL/min   Comment:            The eGFR has been calculated     using the CKD EPI equation.     This calculation has not been     validated in all clinical     situations.     eGFR's persistently     <90 mL/min signify     possible Chronic Kidney Disease.  TROPONIN I     Status: None    Collection Time    12/19/12  1:27 AM      Result Value Range   Troponin I <0.30  <0.30 ng/mL   Comment:            Due to the release kinetics of cTnI,     a negative result within the first hours     of the onset of symptoms does not rule out     myocardial infarction with certainty.     If myocardial infarction is still suspected,     repeat the test at appropriate intervals.  LIPID PANEL     Status: Abnormal   Collection Time    12/19/12  1:27 AM      Result Value Range   Cholesterol 129  0 - 200 mg/dL   Triglycerides 91  <409 mg/dL   HDL 26 (*) >81 mg/dL   Total CHOL/HDL Ratio 5.0     VLDL 18  0 - 40 mg/dL   LDL Cholesterol 85  0 - 99 mg/dL   Comment:            Total Cholesterol/HDL:CHD Risk     Coronary Heart Disease Risk Table                         Men   Women      1/2 Average Risk   3.4   3.3      Average Risk       5.0   4.4      2 X Average Risk   9.6   7.1      3 X Average Risk  23.4   11.0                Use the calculated Patient Ratio     above and the CHD Risk Table     to determine the patient's CHD Risk.                ATP III CLASSIFICATION (LDL):      <100     mg/dL   Optimal      191-478  mg/dL   Near or Above                        Optimal      130-159  mg/dL   Borderline      295-621  mg/dL   High      >  190     mg/dL   Very High  GLUCOSE, CAPILLARY     Status: None   Collection Time    12/19/12  6:25 AM      Result Value Range   Glucose-Capillary 82  70 - 99 mg/dL   Comment 1 Documented in Chart     Comment 2 Notify RN    HEMOGLOBIN A1C     Status: Abnormal   Collection Time    12/19/12  9:30 AM      Result Value Range   Hemoglobin A1C 6.0 (*) <5.7 %   Comment: (NOTE)                                                                               According to the ADA Clinical Practice Recommendations for 2011, when     HbA1c is used as a screening test:      >=6.5%   Diagnostic of Diabetes Mellitus               (if abnormal result is  confirmed)     5.7-6.4%   Increased risk of developing Diabetes Mellitus     References:Diagnosis and Classification of Diabetes Mellitus,Diabetes     Care,2011,34(Suppl 1):S62-S69 and Standards of Medical Care in             Diabetes - 2011,Diabetes Care,2011,34 (Suppl 1):S11-S61.   Mean Plasma Glucose 126 (*) <117 mg/dL  TROPONIN I     Status: None   Collection Time    12/19/12  9:30 AM      Result Value Range   Troponin I <0.30  <0.30 ng/mL   Comment:            Due to the release kinetics of cTnI,     a negative result within the first hours     of the onset of symptoms does not rule out     myocardial infarction with certainty.     If myocardial infarction is still suspected,     repeat the test at appropriate intervals.  GLUCOSE, CAPILLARY     Status: Abnormal   Collection Time    12/19/12 11:23 AM      Result Value Range   Glucose-Capillary 134 (*) 70 - 99 mg/dL   Comment 1 Documented in Chart     Comment 2 Notify RN    GLUCOSE, CAPILLARY     Status: Abnormal   Collection Time    12/19/12  9:00 PM      Result Value Range   Glucose-Capillary 110 (*) 70 - 99 mg/dL  GLUCOSE, CAPILLARY     Status: Abnormal   Collection Time    12/20/12  7:02 AM      Result Value Range   Glucose-Capillary 104 (*) 70 - 99 mg/dL  GLUCOSE, CAPILLARY     Status: None   Collection Time    12/20/12 11:52 AM      Result Value Range   Glucose-Capillary 93  70 - 99 mg/dL   Dg Chest 2 View  1/61/0960   *RADIOLOGY REPORT*  Clinical Data: Chest pain and stroke symptoms.  CHEST - 2 VIEW  Comparison: 09/04/2009.  Findings: Very  poor inspiration.  Borderline enlarged cardiac silhouette.  Clear lungs.  No significant change in mild prominence of the pulmonary interstitial markings.  Thoracic spine degenerative changes.  IMPRESSION: No acute abnormality.  Very poor inspiration with grossly stable mild chronic interstitial lung disease.   Original Report Authenticated By: Beckie Salts, M.D.   Ct Head Wo  Contrast  12/18/2012   *RADIOLOGY REPORT*  Clinical Data: Onset of slurred speech, repeating himself, holding head in hands, history diabetes, hypercholesterolemia, unable to follow commands  CT HEAD WITHOUT CONTRAST  Technique:  Contiguous axial images were obtained from the base of the skull through the vertex without contrast.  Comparison: None  Findings: Generalized atrophy. Normal ventricular morphology. No midline shift or mass effect. Small vessel chronic ischemic changes of deep cerebral white matter. No intracranial hemorrhage, mass lesion or evidence of acute infarction. No extra-axial fluid collections. Bones and sinuses unremarkable. Extensive atherosclerotic calcifications at skull base.  IMPRESSION: Atrophy with small vessel chronic ischemic changes of deep cerebral white matter. No acute intracranial abnormalities.   Original Report Authenticated By: Ulyses Southward, M.D.   Mr Brain Wo Contrast  12/19/2012   *RADIOLOGY REPORT*  Clinical Data:  Mental status changes.  Slurred speech.  Diabetic.  MRI BRAIN WITHOUT CONTRAST MRA HEAD WITHOUT CONTRAST  Technique: Multiplanar, multiecho pulse sequences of the brain and surrounding structures were obtained according to standard protocol without intravenous contrast.  Angiographic images of the head were obtained using MRA technique without contrast.  Comparison: 12/18/2012 head CT.  No comparison brain MR.  MRI HEAD  Findings:  Very small acute non hemorrhagic left occipital lobe infarct.  Remote small cerebellar infarcts.  Mild to moderate small vessel disease type changes.  Global atrophy without hydrocephalus.  No intracranial hemorrhage.  No intracranial mass lesion detected on this unenhanced exam.  Transverse ligament hypertrophy.  Cervical medullary junction, pituitary region, pineal region and orbital structures unremarkable.  Minimal polypoid opacification inferior aspect left maxillary sinus air  IMPRESSION: Very small acute non hemorrhagic left  occipital lobe infarct.  Remote small cerebellar infarcts.  Mild to moderate small vessel disease type changes.  Global atrophy without hydrocephalus.  MRA HEAD  Findings: Decreased signal right internal carotid artery pre cavernous and cavernous segment may be related to artifact.  Decreased number of visualized right middle cerebral artery branches suggestive of a prominent atherosclerotic type changes.  Ectatic A2 segment right anterior cerebral artery.  Codominant vertebral arteries.  No high-grade stenosis of the distal vertebral arteries or basilar artery.  Mild to moderate irregularity and narrowing involving portions of the PICA bilaterally.  Nonvisualization of the AICA bilaterally.  High-grade stenosis P2 segment posterior cerebral artery bilaterally.  Posterior cerebral artery branch vessel irregularity greater on the left.  Superior cerebral artery irregularity and narrowing.  No aneurysm noted.  IMPRESSION: Decreased number of visualized right middle cerebral artery branches suggestive of a prominent atherosclerotic type changes.  Decreased signal right internal carotid artery pre cavernous and cavernous segment may be related to artifact. Overall evaluation of this level is therefore limited.  High-grade stenosis P2 segment posterior cerebral artery bilaterally.  Posterior cerebral artery branch vessel irregularity greater on the left.  Please see above.  This has been made a PRA call report utilizing dashboard call feature.   Original Report Authenticated By: Lacy Duverney, M.D.   Mr Mra Head/brain Wo Cm  12/19/2012   *RADIOLOGY REPORT*  Clinical Data:  Mental status changes.  Slurred speech.  Diabetic.  MRI BRAIN WITHOUT CONTRAST  MRA HEAD WITHOUT CONTRAST  Technique: Multiplanar, multiecho pulse sequences of the brain and surrounding structures were obtained according to standard protocol without intravenous contrast.  Angiographic images of the head were obtained using MRA technique without  contrast.  Comparison: 12/18/2012 head CT.  No comparison brain MR.  MRI HEAD  Findings:  Very small acute non hemorrhagic left occipital lobe infarct.  Remote small cerebellar infarcts.  Mild to moderate small vessel disease type changes.  Global atrophy without hydrocephalus.  No intracranial hemorrhage.  No intracranial mass lesion detected on this unenhanced exam.  Transverse ligament hypertrophy.  Cervical medullary junction, pituitary region, pineal region and orbital structures unremarkable.  Minimal polypoid opacification inferior aspect left maxillary sinus air  IMPRESSION: Very small acute non hemorrhagic left occipital lobe infarct.  Remote small cerebellar infarcts.  Mild to moderate small vessel disease type changes.  Global atrophy without hydrocephalus.  MRA HEAD  Findings: Decreased signal right internal carotid artery pre cavernous and cavernous segment may be related to artifact.  Decreased number of visualized right middle cerebral artery branches suggestive of a prominent atherosclerotic type changes.  Ectatic A2 segment right anterior cerebral artery.  Codominant vertebral arteries.  No high-grade stenosis of the distal vertebral arteries or basilar artery.  Mild to moderate irregularity and narrowing involving portions of the PICA bilaterally.  Nonvisualization of the AICA bilaterally.  High-grade stenosis P2 segment posterior cerebral artery bilaterally.  Posterior cerebral artery branch vessel irregularity greater on the left.  Superior cerebral artery irregularity and narrowing.  No aneurysm noted.  IMPRESSION: Decreased number of visualized right middle cerebral artery branches suggestive of a prominent atherosclerotic type changes.  Decreased signal right internal carotid artery pre cavernous and cavernous segment may be related to artifact. Overall evaluation of this level is therefore limited.  High-grade stenosis P2 segment posterior cerebral artery bilaterally.  Posterior cerebral  artery branch vessel irregularity greater on the left.  Please see above.  This has been made a PRA call report utilizing dashboard call feature.   Original Report Authenticated By: Lacy Duverney, M.D.      Assessment: 79 y.o. male admitted to [facility] with confusion and language impairment. Neuro-exam is significant for dysphasia that seems to be more striking in terms of comprehension of language. MRI-DWI showed a very small left occipital acute infarct that doesn't seem to explain patient's dysphasia. Ictal dysphasia is not very likely. I wonder if patient has an underlying cognitive disorder impairing his language. Will complete stroke work up. Continue plavix and atorvastatin. EEG.  Stroke team to resume care in the morning and make further recommendations accordingly.  Stroke Risk Factors - age, DM, hyperlipidemia.  Plan: 1. HgbA1c, fasting lipid panel 2. MRI, MRA  of the brain without contrast 3. PT consult, OT consult, Speech consult 4. Echocardiogram 5. Carotid dopplers 6. Prophylactic therapy-plavix 75 mg daily. 7. Risk factor modification 8. Telemetry monitoring 9. Frequent neuro checks   Wyatt Portela, MD [facility] [phone]  12/20/2012, 12:44 PM

## 2012-12-20 NOTE — Progress Notes (Signed)
Physical Therapy Treatment Patient Details Name: [name] [name] MRN: [id number] DOB: 1923/07/17 Today's Date: 12/20/2012 Time: 1440-1450 PT Time Calculation (min): 10 min  PT Assessment / Plan / Recommendation Comments on Treatment Session  Pt continues to be a safety risk secondary to impulsive behavior     Follow Up Recommendations  Outpatient PT;Supervision/Assistance - 24 hour     Does the patient have the potential to tolerate intense rehabilitation     Barriers to Discharge        Equipment Recommendations  None recommended by PT    Recommendations for Other Services    Frequency Min 4X/week   Plan Discharge plan remains appropriate;Frequency remains appropriate    Precautions / Restrictions Precautions Precautions: Fall Precaution Comments: h/o falls inside and outside at home, not very frequent.    Pertinent Vitals/Pain No c/o pain.     Mobility  Bed Mobility Bed Mobility: Supine to Sit;Sit to Supine Supine to Sit: 5: Supervision Sitting - Scoot to Edge of Bed: 4: Min guard Sit to Supine: 4: Min guard Details for Bed Mobility Assistance: Pt did not require physical assistance.  Close supervision to min guard secondary to pt acting impulsively.   Transfers Transfers: Sit to Stand;Stand to Sit Sit to Stand: 4: Min assist;From bed;With upper extremity assist Stand to Sit: 5: Supervision Details for Transfer Assistance: Assist to initiate standing. Assist via verbal and tactile cues to  prevent pt from becoming entangled in his IV line.   Ambulation/Gait Ambulation/Gait Assistance: 4: Min guard Ambulation Distance (Feet): 30 Feet Assistive device: Rolling walker Ambulation/Gait Assistance Details: Attempted to persuede pt to ambulate farther but pt not responding wlked directly to the bed and sat.    Gait Pattern: Step-through pattern;Shuffle;Trunk flexed    Exercises     PT Diagnosis:    PT Problem List:   PT Treatment Interventions:     PT Goals Acute  Rehab PT Goals PT Goal Formulation: With family Time For Goal Achievement: 12/26/12 Potential to Achieve Goals: Good Pt will go Supine/Side to Sit: with modified independence;with HOB 0 degrees PT Goal: Supine/Side to Sit - Progress: Progressing toward goal Pt will go Sit to Supine/Side: with modified independence;with HOB 0 degrees PT Goal: Sit to Supine/Side - Progress: Progressing toward goal Pt will go Sit to Stand: with supervision PT Goal: Sit to Stand - Progress: Progressing toward goal Pt will go Stand to Sit: with supervision PT Goal: Stand to Sit - Progress: Progressing toward goal Pt will Ambulate: >150 feet;with supervision;with cane PT Goal: Ambulate - Progress: Progressing toward goal  Visit Information  Last PT Received On: 12/20/12    Subjective Data  Subjective: pt not responding to verbal communication.     Cognition  Cognition Arousal/Alertness: Awake/alert Behavior During Therapy: Restless Overall Cognitive Status: Impaired/Different from baseline Area of Impairment: Attention;Following commands;Safety/judgement;Awareness;Problem solving Current Attention Level: Sustained Following Commands: Follows one step commands with increased time Safety/Judgement: Decreased awareness of safety Awareness: Emergent;Anticipatory Problem Solving: Slow processing;Difficulty sequencing;Decreased initiation;Requires verbal cues;Requires tactile cues    Balance  Balance Balance Assessed: Yes Static Sitting Balance Static Sitting - Balance Support: Bilateral upper extremity supported;Feet supported Static Sitting - Level of Assistance: 7: Independent Static Standing Balance Static Standing - Balance Support: No upper extremity supported Static Standing - Level of Assistance: 5: Stand by assistance Static Standing - Comment/# of Minutes: 1 minute while urinating with no LOB.    End of Session PT - End of Session Equipment Utilized During Treatment: Gait belt  Activity  Tolerance: Patient tolerated treatment well Patient left: in bed;with call bell/phone within reach;with family/visitor present Nurse Communication: Mobility status   GP     Tommy Goostree 12/20/2012, 4:09 PM Nyal Schachter L. Chilton Si, DPT   Pager [phone]     Cell [phone]

## 2012-12-20 NOTE — Progress Notes (Signed)
Patient moved to 4N17 for camera room monitoring. Report given to Rhame, Charity fundraiser.

## 2012-12-20 NOTE — Evaluation (Signed)
Occupational Therapy Evaluation Patient Details Name: [name] [name] MRN: [id number] DOB: 1922-11-22 Today's Date: 12/20/2012 Time: 4098-1191 OT Time Calculation (min): 26 min  OT Assessment / Plan / Recommendation Clinical Impression    79 yo male brought to ED by family due to slurred speech that started day prior of admissin, and AMS. Pt with negative results on CT scan and pending MRI. Pt demonstrates aphasia this session. Pt HOH and difficult to assess receptive deficits or HOH. Pt with hearing aide in and on during session. Ot to follow acutely. Recommend SNF for d/c planning. Wife does not feel she can (A) pt at current level.     OT Assessment  Patient needs continued OT Services    Follow Up Recommendations  SNF;Supervision/Assistance - 24 hour (SNF recommended due to wife self report level of (A) at d/c)    Barriers to Discharge      Equipment Recommendations  3 in 1 bedside comode;Other (comment) (RW)    Recommendations for Other Services    Frequency  Min 2X/week    Precautions / Restrictions Precautions Precautions: Fall Precaution Comments: h/o falls inside and outside at home, not very frequent.    Pertinent Vitals/Pain No pain    ADL  Grooming: Wash/dry hands;Wash/dry face;Teeth care;Moderate assistance Where Assessed - Grooming: Supported standing Toilet Transfer: Minimal Dentist Method: Other (comment) (standing) Toilet Transfer Equipment: Regular height toilet (standing holding RW) Toileting - Clothing Manipulation and Hygiene: Min guard Where Assessed - Toileting Clothing Manipulation and Hygiene: Sit to stand from 3-in-1 or toilet Equipment Used: Gait belt;Rolling walker Transfers/Ambulation Related to ADLs: Pt ambulated to restroom with (A) to guide RW and running into objects on the right side.  ADL Comments: Pt demonstrates deficits locating objects on the right. PT is HOH making visual assessment difficult and therapist using  context of session to state deficits to the right noted. Pt unable to locate paper towels after hand washing and after oral care needing cues twice. Pt needed backward chaining  to complete oral care. Pt needed initiation cue to start brushing teeth. Pt needed (A) to start water. Pt demonstrates cognitive deficits. Wife in room choking and c/o difficulty with swallowing. Pt focusing self to vomit while therapist present. wife c/o food hung in throat. Pt provided warm water to rinse mouth and attempt to swallow or rinse out food. wife educated that visit to ENT might be helpful since swallowing is becoming more of an issue for her by her own self  report. Pt positioned in chair. pt not waving or saying Bye back to therapist.     OT Diagnosis: Generalized weakness;Cognitive deficits  OT Problem List: Decreased strength;Decreased activity tolerance;Impaired balance (sitting and/or standing);Decreased safety awareness;Decreased knowledge of use of DME or AE;Decreased knowledge of precautions;Obesity;Decreased cognition;Impaired vision/perception OT Treatment Interventions: Self-care/ADL training;Therapeutic exercise;Neuromuscular education;DME and/or AE instruction;Therapeutic activities;Cognitive remediation/compensation;Patient/family education;Balance training;Visual/perceptual remediation/compensation   OT Goals Acute Rehab OT Goals OT Goal Formulation: With patient Time For Goal Achievement: 01/03/13 Potential to Achieve Goals: Good ADL Goals Pt Will Perform Grooming: with supervision;Standing at sink;Unsupported ADL Goal: Grooming - Progress: Goal set today Pt Will Perform Upper Body Bathing: with supervision;Standing at sink;Unsupported ADL Goal: Upper Body Bathing - Progress: Goal set today Pt Will Perform Upper Body Dressing: with supervision;Sitting, chair;Unsupported ADL Goal: Upper Body Dressing - Progress: Goal set today Pt Will Transfer to Toilet: with supervision;Ambulation;3-in-1 ADL  Goal: Toilet Transfer - Progress: Goal set today Pt Will Perform Toileting - Clothing Manipulation: with supervision;Sitting  on 3-in-1 or toilet ADL Goal: Toileting - Clothing Manipulation - Progress: Goal set today Pt Will Perform Toileting - Hygiene: with supervision;Sit to stand from 3-in-1/toilet ADL Goal: Toileting - Hygiene - Progress: Goal set today Miscellaneous OT Goals Miscellaneous OT Goal #1: Pt will complete bed mobility Min guard (A)  OT Goal: Miscellaneous Goal #1 - Progress: Goal set today Miscellaneous OT Goal #2: Pt will locate ADL objects on right side 2 out 4 trials during task without cues OT Goal: Miscellaneous Goal #2 - Progress: Goal set today  Visit Information  Last OT Received On: 12/20/12 Assistance Needed: +1    Subjective Data  Subjective: "WHY"- pt very HOH and stating "WHY to most statements and questions Patient Stated Goal: wife feels that she is not able to help patient at home at current state   Prior Functioning     Home Living Lives With: Spouse;Son Available Help at Discharge: Family;Available 24 hours/day Type of Home: House Home Access: Stairs to enter [employer] of Steps: 4 Entrance Stairs-Rails: Right;Left;Can reach both Home Layout: One level Home Adaptive Equipment: Straight cane;Walker - rolling Additional Comments: PTA pt walked both inside and outside with cane Prior Function Level of Independence: Independent with assistive device(s) Able to Take Stairs?: Yes Driving: Yes Vocation: Retired Musician: Receptive difficulties;Expressive difficulties;HOH Dominant Hand: Right         Vision/Perception Vision - History Patient Visual Report: Other (comment) (right visual inattention noted) Vision - Assessment Vision Assessment: Vision not tested   Cognition  Cognition Arousal/Alertness: Awake/alert Behavior During Therapy: Flat affect Overall Cognitive Status: Impaired/Different from  baseline Area of Impairment: Attention;Following commands;Safety/judgement;Awareness;Problem solving Current Attention Level: Sustained Following Commands: Follows one step commands with increased time Safety/Judgement: Decreased awareness of safety Awareness: Emergent;Anticipatory Problem Solving: Slow processing;Difficulty sequencing;Decreased initiation;Requires verbal cues;Requires tactile cues    Extremity/Trunk Assessment Right Upper Extremity Assessment RUE ROM/Strength/Tone: Deficits RUE ROM/Strength/Tone Deficits: 3 out 5 observed during session , cognition and HOH making assessment difficult RUE Coordination: WFL - gross motor Left Upper Extremity Assessment LUE ROM/Strength/Tone: Deficits LUE ROM/Strength/Tone Deficits: 3 out 5 observed during session LUE Coordination: WFL - gross motor     Mobility Bed Mobility Bed Mobility: Supine to Sit;Sitting - Scoot to Edge of Bed Supine to Sit: 3: Mod assist;With rails;HOB elevated Sitting - Scoot to Edge of Bed: 2: Max assist;With rail Details for Bed Mobility Assistance: pt needed cues to initiate and progress to EOB. Pt using pad to help scoot Transfers Transfers: Sit to Stand;Stand to Sit Sit to Stand: 4: Min assist;From elevated surface;From bed Stand to Sit: 4: Min assist;With upper extremity assist;To chair/3-in-1 Details for Transfer Assistance: pt with uncontrolled descend to surface and abandoning RW     Exercise Other Exercises Other Exercises: spoke with wife about d/c planning and pt's wife currently does not feel at patients level of needs she can help (A) him. Wife extremely concerned with financial impact of patient d/c to SNF to the family. Wife will have lots of questions about money required   Balance Static Standing Balance Static Standing - Balance Support: Bilateral upper extremity supported;During functional activity Static Standing - Level of Assistance: 4: Min assist Static Standing - Comment/# of  Minutes: ~ LOB posteriorly twice during sink level task. Pt with LOB releasing BIL UE and LOB with head turn   End of Session OT - End of Session Activity Tolerance: Patient tolerated treatment well Patient left: in chair;with call bell/phone within reach;with family/visitor present Nurse Communication:  Mobility status;Precautions  GO     Harrel Carina [facility] 12/20/2012, 11:09 AM Pager: [phone]

## 2012-12-20 NOTE — Progress Notes (Signed)
Speech Language Pathology Treatment Patient Details Name: [name] [name] MRN: [id number] DOB: [birth year] Today's Date: 12/20/2012 Time: 1103-1130 SLP Time Calculation (min): 27 min  Assessment / Plan / Recommendation    SLP Plan  Continue with current plan of care    Pertinent Vitals/Pain None indicated  SLP Goals  SLP Goals Potential to Achieve Goals: Fair Potential Considerations: Ability to learn/carryover information;Severity of impairments SLP Goal #1: Pt will follow one step commands with 80% accuracy during functional tasks with max assist.  SLP Goal #1 - Progress: Progressing toward goal SLP Goal #2: Pt will identify objects used daily from choice of two with 80% accuracy and max assist.  SLP Goal #2 - Progress: Progressing toward goal SLP Goal #3: Pt will participate in automatic speech tasks with 80% accuracy and max assist to maximize expressive language rehabiliation.  SLP Goal #3 - Progress: Progressing toward goal SLP Goal #4: Pt will answer basic y/n questions re: current environment with 80% accuracy and total assist.  SLP Goal #4 - Progress: Progressing toward goal  General Temperature Spikes Noted: No Respiratory Status: Room air Behavior/Cognition: Alert;Hard of hearing;Decreased sustained attention;Doesn't follow directions;Requires cueing Oral Cavity - Dentition: Dentures, top;Poor condition Patient Positioning: Upright in bed  Oral Cavity - Oral Hygiene Does patient have any of the following "at risk" factors?: None of the above Brush patient's teeth BID with toothbrush (using toothpaste with fluoride): Yes   Treatment/clinical impression Treatment focused on: Aphasia;Cognition  Skilled Treatment: F/u after yesterday's speech and swallow evals.  Pt requiring max verbal/visual cues to elicit communication and participation.  Wife reports that he occasionally responds to her questions, but has rarely initiated to request care or make needs known. Results  of MRI revealed very small acute non hemorrhagic left occipital lobe infarct.  Pt in bed with eyes closed; both hearing aids placed.  Speaking loudly with visual cues, pt responded to clinician and spouse's encouragement to eat with "You get it, I don't want it" and "take it yourself, honey, I don't want it."  Pt appears fatigued, falls asleep easily.  Difficult to ascertain impact of hearing loss on communication deficits, but suspect it is primary barrier given site of neuro lesion, which is unlikely to result in an aphasia. Wife very concerned that pt is "giving up."  SLP to follow for goals for improved function and safety - provided pt can be encouraged to participate.     GO     Blenda Mounts [place] 12/20/2012, 11:41 AM

## 2012-12-20 NOTE — Progress Notes (Signed)
Subjective: Patient resting, comfortably. Still aphasic. He still will not follow any commands. However if you pull the cover of him he will pull the covers back across himself. According to the wife he did get up to walk to the bathroom. He is very resistant to any examination. MRI did show a stroke. Patient was normal aspirin at home, Plavix will be added, neurology input will be requested  Objective: Vital signs in last 24 hours: Temp:  [97.2 F (36.2 C)-98.2 F (36.8 C)] 98.1 F (36.7 C) (05/21 1000) Pulse Rate:  [53-82] 58 (05/21 1000) Resp:  [18-20] 18 (05/21 1000) BP: (148-170)/(68-73) 151/71 mmHg (05/21 1000) SpO2:  [96 %-99 %] 96 % (05/21 1000) Weight change:  Last BM Date:  (PTA)  Intake/Output from previous day: 05/20 0701 - 05/21 0700 In: -  Out: 250 [Urine:250] Intake/Output this shift:    General appearance: patient in no apparent distress Resp: clear to auscultation bilaterally Cardio: regular rate and rhythm, S1, S2 normal, no murmur, click, rub or gallop Extremities: extremities normal, atraumatic, no cyanosis or edema Neurologic: Motor: grossly normal The patient will not follow any commandsl Lab Results:  Results for orders placed during the hospital encounter of 12/18/12 (from the past 24 hour(s))  GLUCOSE, CAPILLARY     Status: Abnormal   Collection Time    12/19/12 11:23 AM      Result Value Range   Glucose-Capillary 134 (*) 70 - 99 mg/dL   Comment 1 Documented in Chart     Comment 2 Notify RN    GLUCOSE, CAPILLARY     Status: Abnormal   Collection Time    12/19/12  9:00 PM      Result Value Range   Glucose-Capillary 110 (*) 70 - 99 mg/dL  GLUCOSE, CAPILLARY     Status: Abnormal   Collection Time    12/20/12  7:02 AM      Result Value Range   Glucose-Capillary 104 (*) 70 - 99 mg/dL      Studies/Results: Dg Chest 2 View  12/18/2012   *RADIOLOGY REPORT*  Clinical Data: Chest pain and stroke symptoms.  CHEST - 2 VIEW  Comparison: 09/04/2009.   Findings: Very poor inspiration.  Borderline enlarged cardiac silhouette.  Clear lungs.  No significant change in mild prominence of the pulmonary interstitial markings.  Thoracic spine degenerative changes.  IMPRESSION: No acute abnormality.  Very poor inspiration with grossly stable mild chronic interstitial lung disease.   Original Report Authenticated By: Beckie Salts, M.[name].   Ct Head Wo Contrast  12/18/2012   *RADIOLOGY REPORT*  Clinical Data: Onset of slurred speech, repeating himself, holding head in hands, history diabetes, hypercholesterolemia, unable to follow commands  CT HEAD WITHOUT CONTRAST  Technique:  Contiguous axial images were obtained from the base of the skull through the vertex without contrast.  Comparison: None  Findings: Generalized atrophy. Normal ventricular morphology. No midline shift or mass effect. Small vessel chronic ischemic changes of deep cerebral white matter. No intracranial hemorrhage, mass lesion or evidence of acute infarction. No extra-axial fluid collections. Bones and sinuses unremarkable. Extensive atherosclerotic calcifications at skull base.  IMPRESSION: Atrophy with small vessel chronic ischemic changes of deep cerebral white matter. No acute intracranial abnormalities.   Original Report Authenticated By: Ulyses Southward, M.[name].   Mr Brain Wo Contrast  12/19/2012   *RADIOLOGY REPORT*  Clinical Data:  Mental status changes.  Slurred speech.  Diabetic.  MRI BRAIN WITHOUT CONTRAST MRA HEAD WITHOUT CONTRAST  Technique: Multiplanar, multiecho pulse  sequences of the brain and surrounding structures were obtained according to standard protocol without intravenous contrast.  Angiographic images of the head were obtained using MRA technique without contrast.  Comparison: 12/18/2012 head CT.  No comparison brain MR.  MRI HEAD  Findings:  Very small acute non hemorrhagic left occipital lobe infarct.  Remote small cerebellar infarcts.  Mild to moderate small vessel disease type  changes.  Global atrophy without hydrocephalus.  No intracranial hemorrhage.  No intracranial mass lesion detected on this unenhanced exam.  Transverse ligament hypertrophy.  Cervical medullary junction, pituitary region, pineal region and orbital structures unremarkable.  Minimal polypoid opacification inferior aspect left maxillary sinus air  IMPRESSION: Very small acute non hemorrhagic left occipital lobe infarct.  Remote small cerebellar infarcts.  Mild to moderate small vessel disease type changes.  Global atrophy without hydrocephalus.  MRA HEAD  Findings: Decreased signal right internal carotid artery pre cavernous and cavernous segment may be related to artifact.  Decreased number of visualized right middle cerebral artery branches suggestive of a prominent atherosclerotic type changes.  Ectatic A2 segment right anterior cerebral artery.  Codominant vertebral arteries.  No high-grade stenosis of the distal vertebral arteries or basilar artery.  Mild to moderate irregularity and narrowing involving portions of the PICA bilaterally.  Nonvisualization of the AICA bilaterally.  High-grade stenosis P2 segment posterior cerebral artery bilaterally.  Posterior cerebral artery branch vessel irregularity greater on the left.  Superior cerebral artery irregularity and narrowing.  No aneurysm noted.  IMPRESSION: Decreased number of visualized right middle cerebral artery branches suggestive of a prominent atherosclerotic type changes.  Decreased signal right internal carotid artery pre cavernous and cavernous segment may be related to artifact. Overall evaluation of this level is therefore limited.  High-grade stenosis P2 segment posterior cerebral artery bilaterally.  Posterior cerebral artery branch vessel irregularity greater on the left.  Please see above.  This has been made a PRA call report utilizing dashboard call feature.   Original Report Authenticated By: Lacy Duverney, M.[name].   Mr Mra Head/brain Wo  Cm  12/19/2012   *RADIOLOGY REPORT*  Clinical Data:  Mental status changes.  Slurred speech.  Diabetic.  MRI BRAIN WITHOUT CONTRAST MRA HEAD WITHOUT CONTRAST  Technique: Multiplanar, multiecho pulse sequences of the brain and surrounding structures were obtained according to standard protocol without intravenous contrast.  Angiographic images of the head were obtained using MRA technique without contrast.  Comparison: 12/18/2012 head CT.  No comparison brain MR.  MRI HEAD  Findings:  Very small acute non hemorrhagic left occipital lobe infarct.  Remote small cerebellar infarcts.  Mild to moderate small vessel disease type changes.  Global atrophy without hydrocephalus.  No intracranial hemorrhage.  No intracranial mass lesion detected on this unenhanced exam.  Transverse ligament hypertrophy.  Cervical medullary junction, pituitary region, pineal region and orbital structures unremarkable.  Minimal polypoid opacification inferior aspect left maxillary sinus air  IMPRESSION: Very small acute non hemorrhagic left occipital lobe infarct.  Remote small cerebellar infarcts.  Mild to moderate small vessel disease type changes.  Global atrophy without hydrocephalus.  MRA HEAD  Findings: Decreased signal right internal carotid artery pre cavernous and cavernous segment may be related to artifact.  Decreased number of visualized right middle cerebral artery branches suggestive of a prominent atherosclerotic type changes.  Ectatic A2 segment right anterior cerebral artery.  Codominant vertebral arteries.  No high-grade stenosis of the distal vertebral arteries or basilar artery.  Mild to moderate irregularity and narrowing involving portions of  the PICA bilaterally.  Nonvisualization of the AICA bilaterally.  High-grade stenosis P2 segment posterior cerebral artery bilaterally.  Posterior cerebral artery branch vessel irregularity greater on the left.  Superior cerebral artery irregularity and narrowing.  No aneurysm noted.   IMPRESSION: Decreased number of visualized right middle cerebral artery branches suggestive of a prominent atherosclerotic type changes.  Decreased signal right internal carotid artery pre cavernous and cavernous segment may be related to artifact. Overall evaluation of this level is therefore limited.  High-grade stenosis P2 segment posterior cerebral artery bilaterally.  Posterior cerebral artery branch vessel irregularity greater on the left.  Please see above.  This has been made a PRA call report utilizing dashboard call feature.   Original Report Authenticated By: Lacy Duverney, M.[name].   Carotid Doppler  Preliminary report: Bilateral: No evidence of hemodynamically significant internal carotid artery stenosis. Vertebral artery flow is antegrade.    Medications:  Prior to Admission:  Prescriptions prior to admission  Medication Sig Dispense Refill  . ibuprofen (ADVIL,MOTRIN) 200 MG tablet Take 200 mg by mouth once.      . meloxicam (MOBIC) 7.5 MG tablet Take 7.5 mg by mouth daily.      . pioglitazone (ACTOS) 45 MG tablet Take 45 mg by mouth daily.       Scheduled: . atorvastatin  20 mg Oral q1800  . [START ON 12/21/2012] clopidogrel  75 mg Oral Q breakfast  . enoxaparin (LOVENOX) injection  30 mg Subcutaneous Q24H  . pantoprazole  40 mg Oral Q0600   Continuous: . sodium chloride 50 mL/hr at 12/18/12 2307    Assessment/Plan: CVA,MRI showing Very small acute non hemorrhagic left occipital lobe infarct.  Remote small cerebellar infarcts.  Mild to moderate small vessel disease type changes.  Global atrophy without hydrocephalus.  Carotid without stenosis, 2-[name] echo pending. Patient primarily presenting with aphasia. Plavix will be added, neurology input will be requested. Continue PT OT speech therapy. Patient may need skilled nursing facility Diabetes well controlled Hypercholesterolemia , well controlled  LOS: 2 days   [name] [name] 12/20/2012, 10:49 AM

## 2012-12-21 DIAGNOSIS — R4701 Aphasia: Secondary | ICD-10-CM | POA: Diagnosis not present

## 2012-12-21 DIAGNOSIS — R4182 Altered mental status, unspecified: Secondary | ICD-10-CM | POA: Diagnosis not present

## 2012-12-21 DIAGNOSIS — I6350 Cerebral infarction due to unspecified occlusion or stenosis of unspecified cerebral artery: Secondary | ICD-10-CM | POA: Diagnosis not present

## 2012-12-21 DIAGNOSIS — E119 Type 2 diabetes mellitus without complications: Secondary | ICD-10-CM | POA: Diagnosis not present

## 2012-12-21 DIAGNOSIS — R1310 Dysphagia, unspecified: Secondary | ICD-10-CM | POA: Diagnosis not present

## 2012-12-21 LAB — GLUCOSE, CAPILLARY
Glucose-Capillary: 106 mg/dL — ABNORMAL HIGH (ref 70–99)
Glucose-Capillary: 141 mg/dL — ABNORMAL HIGH (ref 70–99)
Glucose-Capillary: 125 mg/dL — ABNORMAL HIGH (ref 70–99)

## 2012-12-21 MED ORDER — ENOXAPARIN SODIUM 40 MG/0.4ML ~~LOC~~ SOLN
40.0000 mg | SUBCUTANEOUS | Status: DC
  Administered 2012-12-21: 40 mg via SUBCUTANEOUS
  Filled 2012-12-21 (×2): qty 0.4

## 2012-12-21 NOTE — Progress Notes (Signed)
Physical Therapy Treatment Patient Details Name: [name] [name] MRN: [id number] DOB: 03-14-1923 Today's Date: 12/21/2012 Time: 0950-1009 PT Time Calculation (min): 19 min  PT Assessment / Plan / Recommendation Comments on Treatment Session  Pt cont's to be a safety risk due to impulsiveness & poor safety awareness.   Pt not demonstrating consistent performance with mobility as he did on initial therapy session (eval).  Pt requires max cueing for increased safety but not receptive to any cues in today's session.   d/c plans need to be updated to ST-SNF to maximize functional mobility & safety with mobility at d/c.      Follow Up Recommendations  SNF     Does the patient have the potential to tolerate intense rehabilitation     Barriers to Discharge        Equipment Recommendations  None recommended by PT    Recommendations for Other Services    Frequency Min 4X/week   Plan Discharge plan needs to be updated    Precautions / Restrictions Precautions Precautions: Fall Precaution Comments: h/o falls inside and outside at home, not very frequent.        Mobility  Bed Mobility Bed Mobility: Supine to Sit;Sit to Supine Supine to Sit: 5: Supervision;HOB flat Sit to Supine: 5: Supervision;HOB flat Details for Bed Mobility Assistance: Pt required max encouragement to perform & to get pt to understand what I wanted him to do but able to complete without physical (A).   Transfers Transfers: Sit to Stand;Stand to Sit Sit to Stand: 4: Min assist;With upper extremity assist;From bed Stand to Sit: 4: Min assist;With upper extremity assist;To chair/3-in-1 Details for Transfer Assistance: (A) for balance & safety.  Pt impulsively standing; pushing this clinicians hand away from him.  Demonstrates poor safety awareness for need for help.  Ambulation/Gait Ambulation/Gait Assistance: 4: Min assist Ambulation Distance (Feet): 30 Feet Assistive device: Rolling walker Ambulation/Gait  Assistance Details: Pt not receptive to encouragement for use of AD to increase safety & stability.  Very impulsive.  Encouraged increased distance but pt only ambulating bed<>bathroom & became irritated with encouragement & went straight back to bed.  Attempted to have pt use either cane or RW but pt not paying this clinician any attention.   Gait Pattern: Step-through pattern;Shuffle;Trunk flexed;Narrow base of support Modified Rankin (Stroke Patients Only) Pre-Morbid Rankin Score: No symptoms Modified Rankin: Moderately severe disability      PT Goals Acute Rehab PT Goals Time For Goal Achievement: 12/26/12 Potential to Achieve Goals: Good Pt will go Supine/Side to Sit: with modified independence;with HOB 0 degrees PT Goal: Supine/Side to Sit - Progress: Progressing toward goal Pt will go Sit to Supine/Side: with modified independence;with HOB 0 degrees PT Goal: Sit to Supine/Side - Progress: Progressing toward goal Pt will go Sit to Stand: with supervision PT Goal: Sit to Stand - Progress: Not met Pt will go Stand to Sit: with supervision PT Goal: Stand to Sit - Progress: Not met Pt will Ambulate: >150 feet;with supervision;with cane PT Goal: Ambulate - Progress: Not met Pt will Go Up / Down Stairs: 3-5 stairs;with supervision;with rail(s);with least restrictive assistive device  Visit Information  Last PT Received On: 12/21/12 Assistance Needed: +1    Subjective Data      Cognition  Cognition Arousal/Alertness: Awake/alert Behavior During Therapy: Restless Overall Cognitive Status: Impaired/Different from baseline Area of Impairment: Attention;Following commands;Safety/judgement;Awareness;Problem solving Current Attention Level: Sustained Following Commands: Follows one step commands with increased time Safety/Judgement: Decreased awareness of safety;Decreased awareness  of deficits Awareness: Emergent;Anticipatory Problem Solving: Slow processing;Difficulty  sequencing;Requires verbal cues;Requires tactile cues;Decreased initiation General Comments: pt impulsive & with poor safety awareness & need for assistance.  Pt pushing therapists' hand away & stating "don't touch me" when standing up & ambulating, as well as he would not use an AD but was very unsteady without.      Balance     End of Session PT - End of Session Equipment Utilized During Treatment: Gait belt Patient left: in bed;with bed alarm set;Other (comment) (NT/sitter present) Nurse Communication: Mobility status     Verdell Face, [place] [phone] 12/21/2012

## 2012-12-21 NOTE — Progress Notes (Signed)
Clinical Social Work Department CLINICAL SOCIAL WORK PLACEMENT NOTE 12/21/2012  Patient:  AZLAAN, ISIDORE  Account Number:  [id number] Admit date:  12/18/2012  Clinical Social Worker:  Robin Searing  Date/time:  12/21/2012 02:53 PM  Clinical Social Work is seeking post-discharge placement for this patient at the following level of care:   SKILLED NURSING   (*CSW will update this form in Epic as items are completed)   12/21/2012  Patient/family provided with [facility] Department of Clinical Social Work's list of facilities offering this level of care within the geographic area requested by the patient (or if unable, by the patient's family).  12/21/2012  Patient/family informed of their freedom to choose among providers that offer the needed level of care, that participate in Medicare, Medicaid or managed care program needed by the patient, have an available bed and are willing to accept the patient.  12/21/2012  Patient/family informed of [facility]' ownership interest in [facility], as well as of the fact that they are under no obligation to receive care at this facility.  PASARR submitted to EDS on 12/21/2012 PASARR number received from EDS on 12/21/2012  FL2 transmitted to all facilities in geographic area requested by pt/family on   FL2 transmitted to all facilities within larger geographic area on   Patient informed that his/her managed care company has contracts with or will negotiate with  certain facilities, including the following:     Patient/family informed of bed offers received:   Patient chooses bed at  Physician recommends and patient chooses bed at    Patient to be transferred to  on   Patient to be transferred to facility by   The following physician request were entered in Epic:   Additional Comments:  Reece Levy, MSW, Theresia Majors [phone]

## 2012-12-21 NOTE — Progress Notes (Signed)
Stroke Team Progress Note  HISTORY  [name] [name] is an 79 y.o. male, right handed, with a past medical history significant for DM, hypercholesterolemia, admitted to [facility] with new altered mental status and difficulty speaking.  He is described by his family as a very active and functional individual who was in his usual state of health until last Sunday when he became " kind of confused, holding his head with his hands, and not able to comprehend and speak properly". However, wife stated that at times he is able to speak clearly but not long sentences.   According to his wife he is " little bit forgetful and at times can get upset easily" but otherwise they report no significant changes in his condition.  CT brain showed no acute intracranial abnormality but MRI-DWI demonstrated a very small acute left occipital infarct. MRA brain significant for high grade stenosis P2 segment bilateral PCA's.    Date last known well: 12/18/12  Time last known well: uncertain  tPA Given: No, out of the window for thrombolytics.    SUBJECTIVE  No confusion. Some question of dementia at baseline. Feels near baseline. Wife in room.    OBJECTIVE Most recent Vital Signs: Filed Vitals:   12/20/12 1639 12/20/12 2132 12/21/12 0215 12/21/12 0609  BP: 155/63 135/71 150/70 132/59  Pulse: 50 54 54 56  Temp: 97.9 F (36.6 C) 98 F (36.7 C) 98.7 F (37.1 C) 98 F (36.7 C)  TempSrc: Oral Oral Oral Oral  Resp: 18 24 21 20   Height:      Weight:      SpO2: 98% 97% 93% 100%   CBG (last 3)   Recent Labs  12/20/12 1633 12/20/12 2231 12/21/12 0700  GLUCAP 119* 131* 106*    IV Fluid Intake:   . sodium chloride 50 mL/hr at 12/18/12 2307    MEDICATIONS  . atorvastatin  20 mg Oral q1800  . clopidogrel  75 mg Oral Q breakfast  . enoxaparin (LOVENOX) injection  30 mg Subcutaneous Q24H  . pantoprazole  40 mg Oral Q0600   PRN:  acetaminophen, acetaminophen, ondansetron (ZOFRAN) IV, senna-docusate  Diet:   Dysphagia 3 liquids Activity:  Bedrest DVT Prophylaxis:  Lovenox  CLINICALLY SIGNIFICANT STUDIES Basic Metabolic Panel:  Recent Labs Lab 12/18/12 1605 12/18/12 2306  NA 138  --   K 4.1  --   CL 104  --   CO2 23  --   GLUCOSE 115*  --   BUN 12  --   CREATININE 0.90 0.84  CALCIUM 8.6  --    Liver Function Tests:  Recent Labs Lab 12/18/12 1605  AST 16  ALT 9  ALKPHOS 51  BILITOT 0.7  PROT 6.3  ALBUMIN 3.5   CBC:  Recent Labs Lab 12/18/12 1605 12/18/12 2306  WBC 9.2 9.2  HGB 13.8 13.9  HCT 39.5 39.4  MCV 85.3 84.7  PLT 121* 126*   Coagulation:  Recent Labs Lab 12/18/12 1605  LABPROT 13.5  INR 1.04   Cardiac Enzymes:  Recent Labs Lab 12/18/12 2019 12/19/12 0127 12/19/12 0930  TROPONINI <0.30 <0.30 <0.30   Urinalysis:  Recent Labs Lab 12/18/12 2251  COLORURINE YELLOW  LABSPEC 1.020  PHURINE 6.5  GLUCOSEU NEGATIVE  HGBUR NEGATIVE  BILIRUBINUR NEGATIVE  KETONESUR 40*  PROTEINUR NEGATIVE  UROBILINOGEN 1.0  NITRITE NEGATIVE  LEUKOCYTESUR NEGATIVE   Lipid Panel    Component Value Date/Time   CHOL 129 12/19/2012 0127   TRIG 91 12/19/2012 0127  HDL 26* 12/19/2012 0127   CHOLHDL 5.0 12/19/2012 0127   VLDL 18 12/19/2012 0127   LDLCALC 85 12/19/2012 0127   HgbA1C  Lab Results  Component Value Date   HGBA1C 6.0* 12/19/2012    Urine Drug Screen:   No results found for this basename: labopia, cocainscrnur, labbenz, amphetmu, thcu, labbarb    Alcohol Level: No results found for this basename: ETH,  in the last 168 hours  Mr Brain Wo Contrast 12/19/2012   MRI HEAD   Very small acute non hemorrhagic left occipital lobe infarct.  Remote small cerebellar infarcts.  Mild to moderate small vessel disease type changes.  Global atrophy without hydrocephalus.   MRA HEAD  Decreased number of visualized right middle cerebral artery branches suggestive of a prominent atherosclerotic type changes.  Decreased signal right internal carotid artery pre cavernous  and cavernous segment may be related to artifact. Overall evaluation of this level is therefore limited.  High-grade stenosis P2 segment posterior cerebral artery bilaterally.  Posterior cerebral artery branch vessel irregularity greater on the left.    CT of the brain   12/18/2012 Atrophy with small vessel chronic ischemic changes of deep cerebral white matter. No acute intracranial abnormalities  2D Echocardiogram    Carotid Doppler  Technically difficult due to tortuosity and highbifurcation. No obvious significant extracranial carotid artery stenosis demonstrated. Vertebrals are patent with antegrade flow.  CXR  No acute abnormality. Very poor inspiration with grossly stable mild chronic interstitial lung disease.   EKG     Therapy Recommendations outpatient   Physical Exam   Mental Status:  Alert and awake. Disoriented to place, year, situation. His comprehension of spoken language seems top be impaired and he is not able to speak fluently at the time of this assessment.  Cranial Nerves:  II: Discs flat bilaterally; Visual fields grossly normal, pupils equal, round, reactive to light and accommodation  III,IV, VI: ptosis not present, extra-ocular motions intact bilaterally  V,VII: smile symmetric, facial light touch sensation normal bilaterally  VIII: hearing normal bilaterally  IX,X: gag reflex present  XI: bilateral shoulder shrug  XII: midline tongue extension  Motor:  Right : Upper extremity 5/5 Left: Upper extremity 5/5  Lower extremity 5/5 Lower extremity 5/5  Tone and bulk:normal tone throughout; no atrophy noted  Sensory: Pinprick and light touch intact throughout, bilaterally  Deep Tendon Reflexes: 2+ and symmetric throughout  Plantars:  Right: downgoing Left: downgoing  Cerebellar:  Unable to perform as he doesn't follow commands properly.  Gait:  No tested.   ASSESSMENT [name] is a 79 y.o. male presenting with acute delirium, dysarthria.  Imaging confirms small acute non hemorrhagic left occipital lobe infarct, felt to be thrombotic secondary to small vessel disease.  On no anticoagulants prior to admission. Now on clopidogrel 75 mg orally every day for secondary stroke prevention. Patient with resultant delirium. Work up underway.   Diabetes mellitus, type 2, controlled, hgb a1c 6.0  Hyperlipidemia  Long term medication use  Underlying dementia suspected prior to admission  Hospital day # 3  TREATMENT/PLAN  Continue clopidogrel 75 mg orally every day for secondary stroke prevention.  Risk factor modification  LDL 85, goal < 70 in diabetics, placed on atorvastatin  Await echo results  SNF per physical therapy  Gwendolyn Lima. Manson Passey, [facility], MBA, MHA [facility] Pager: [phone] 12/21/2012 10:02 AM  I have personally obtained a history, examined the patient, evaluated imaging results, and formulated the assessment and plan of  care. I agree with the above. Delia Heady, MD

## 2012-12-21 NOTE — Progress Notes (Signed)
Clinical Social Work Department BRIEF PSYCHOSOCIAL ASSESSMENT 12/21/2012  Patient:  [name] [name], [name] [name]     Account Number:  [id number]     Admit date:  12/18/2012  Clinical Social Worker:  Robin Searing  Date/Time:  12/21/2012 02:41 PM  Referred by:  Physician  Date Referred:  12/21/2012 Referred for  SNF Placement   Other Referral:   Interview type:  Family Other interview type:   met with wife and son at bedside    PSYCHOSOCIAL DATA Living Status:  FAMILY Admitted from facility:   Level of care:   Primary support name:  wife Primary support relationship to patient:  FAMILY Degree of support available:   good    CURRENT CONCERNS Current Concerns  Post-Acute Placement   Other Concerns:    SOCIAL WORK ASSESSMENT / PLAN Met with wife and son to discuss ?SNF- wife is uncertain that she wants SNF but is open to the SNF search in case she is not able to take him home-   Assessment/plan status:  Other - See comment Other assessment/ plan:   FL2 and Pasarr for SNF   Information/referral to community resources:   SNF  [facility]  EMS    PATIENT'S/FAMILY'S RESPONSE TO PLAN OF CARE: Family agreeable to SNF search in case patient needs this setting prior to d/c home- family is very supportive and involved- hopeful for improvements with  PT.OT and SLP       Reece Levy, MSW, Theresia Majors [phone]

## 2012-12-21 NOTE — Progress Notes (Signed)
Subjective: No problems per nursing. Neurology input appreciated, patient does appear to be mobile and active, trying to feed himself however still presents with aphasia.  Objective: Vital signs in last 24 hours: Temp:  [97.7 F (36.5 C)-98.7 F (37.1 C)] 98 F (36.7 C) (05/22 0609) Pulse Rate:  [50-72] 56 (05/22 0609) Resp:  [18-24] 20 (05/22 0609) BP: (132-155)/(59-71) 132/59 mmHg (05/22 0609) SpO2:  [93 %-100 %] 100 % (05/22 0609) Weight change:  Last BM Date:  (PTA)  Intake/Output from previous day:   Intake/Output this shift:    General appearance: No apparent distress Resp: clear to auscultation bilaterally Cardio: regular rate and rhythm, S1, S2 normal, no murmur, click, rub or gallop Extremities: extremities normal, atraumatic, no cyanosis or edema Neurologic: Motor: grossly normal Patient still rather aphasic Lab Results:  Results for orders placed during the hospital encounter of 12/18/12 (from the past 24 hour(s))  GLUCOSE, CAPILLARY     Status: None   Collection Time    12/20/12 11:52 AM      Result Value Range   Glucose-Capillary 93  70 - 99 mg/dL  GLUCOSE, CAPILLARY     Status: Abnormal   Collection Time    12/20/12  4:33 PM      Result Value Range   Glucose-Capillary 119 (*) 70 - 99 mg/dL  GLUCOSE, CAPILLARY     Status: Abnormal   Collection Time    12/20/12 10:31 PM      Result Value Range   Glucose-Capillary 131 (*) 70 - 99 mg/dL  GLUCOSE, CAPILLARY     Status: Abnormal   Collection Time    12/21/12  7:00 AM      Result Value Range   Glucose-Capillary 106 (*) 70 - 99 mg/dL      Studies/Results: Mr Brain Wo Contrast  12/19/2012   *RADIOLOGY REPORT*  Clinical Data:  Mental status changes.  Slurred speech.  Diabetic.  MRI BRAIN WITHOUT CONTRAST MRA HEAD WITHOUT CONTRAST  Technique: Multiplanar, multiecho pulse sequences of the brain and surrounding structures were obtained according to standard protocol without intravenous contrast.  Angiographic  images of the head were obtained using MRA technique without contrast.  Comparison: 12/18/2012 head CT.  No comparison brain MR.  MRI HEAD  Findings:  Very small acute non hemorrhagic left occipital lobe infarct.  Remote small cerebellar infarcts.  Mild to moderate small vessel disease type changes.  Global atrophy without hydrocephalus.  No intracranial hemorrhage.  No intracranial mass lesion detected on this unenhanced exam.  Transverse ligament hypertrophy.  Cervical medullary junction, pituitary region, pineal region and orbital structures unremarkable.  Minimal polypoid opacification inferior aspect left maxillary sinus air  IMPRESSION: Very small acute non hemorrhagic left occipital lobe infarct.  Remote small cerebellar infarcts.  Mild to moderate small vessel disease type changes.  Global atrophy without hydrocephalus.  MRA HEAD  Findings: Decreased signal right internal carotid artery pre cavernous and cavernous segment may be related to artifact.  Decreased number of visualized right middle cerebral artery branches suggestive of a prominent atherosclerotic type changes.  Ectatic A2 segment right anterior cerebral artery.  Codominant vertebral arteries.  No high-grade stenosis of the distal vertebral arteries or basilar artery.  Mild to moderate irregularity and narrowing involving portions of the PICA bilaterally.  Nonvisualization of the AICA bilaterally.  High-grade stenosis P2 segment posterior cerebral artery bilaterally.  Posterior cerebral artery branch vessel irregularity greater on the left.  Superior cerebral artery irregularity and narrowing.  No aneurysm noted.  IMPRESSION: Decreased number of visualized right middle cerebral artery branches suggestive of a prominent atherosclerotic type changes.  Decreased signal right internal carotid artery pre cavernous and cavernous segment may be related to artifact. Overall evaluation of this level is therefore limited.  High-grade stenosis P2 segment  posterior cerebral artery bilaterally.  Posterior cerebral artery branch vessel irregularity greater on the left.  Please see above.  This has been made a PRA call report utilizing dashboard call feature.   Original Report Authenticated By: Lacy Duverney, M.D.   Mr Mra Head/brain Wo Cm  12/19/2012   *RADIOLOGY REPORT*  Clinical Data:  Mental status changes.  Slurred speech.  Diabetic.  MRI BRAIN WITHOUT CONTRAST MRA HEAD WITHOUT CONTRAST  Technique: Multiplanar, multiecho pulse sequences of the brain and surrounding structures were obtained according to standard protocol without intravenous contrast.  Angiographic images of the head were obtained using MRA technique without contrast.  Comparison: 12/18/2012 head CT.  No comparison brain MR.  MRI HEAD  Findings:  Very small acute non hemorrhagic left occipital lobe infarct.  Remote small cerebellar infarcts.  Mild to moderate small vessel disease type changes.  Global atrophy without hydrocephalus.  No intracranial hemorrhage.  No intracranial mass lesion detected on this unenhanced exam.  Transverse ligament hypertrophy.  Cervical medullary junction, pituitary region, pineal region and orbital structures unremarkable.  Minimal polypoid opacification inferior aspect left maxillary sinus air  IMPRESSION: Very small acute non hemorrhagic left occipital lobe infarct.  Remote small cerebellar infarcts.  Mild to moderate small vessel disease type changes.  Global atrophy without hydrocephalus.  MRA HEAD  Findings: Decreased signal right internal carotid artery pre cavernous and cavernous segment may be related to artifact.  Decreased number of visualized right middle cerebral artery branches suggestive of a prominent atherosclerotic type changes.  Ectatic A2 segment right anterior cerebral artery.  Codominant vertebral arteries.  No high-grade stenosis of the distal vertebral arteries or basilar artery.  Mild to moderate irregularity and narrowing involving portions of  the PICA bilaterally.  Nonvisualization of the AICA bilaterally.  High-grade stenosis P2 segment posterior cerebral artery bilaterally.  Posterior cerebral artery branch vessel irregularity greater on the left.  Superior cerebral artery irregularity and narrowing.  No aneurysm noted.  IMPRESSION: Decreased number of visualized right middle cerebral artery branches suggestive of a prominent atherosclerotic type changes.  Decreased signal right internal carotid artery pre cavernous and cavernous segment may be related to artifact. Overall evaluation of this level is therefore limited.  High-grade stenosis P2 segment posterior cerebral artery bilaterally.  Posterior cerebral artery branch vessel irregularity greater on the left.  Please see above.  This has been made a PRA call report utilizing dashboard call feature.   Original Report Authenticated By: Lacy Duverney, M.D.    Medications:  Prior to Admission:  Prescriptions prior to admission  Medication Sig Dispense Refill  . ibuprofen (ADVIL,MOTRIN) 200 MG tablet Take 200 mg by mouth once.      . meloxicam (MOBIC) 7.5 MG tablet Take 7.5 mg by mouth daily.      . pioglitazone (ACTOS) 45 MG tablet Take 45 mg by mouth daily.       Scheduled: . atorvastatin  20 mg Oral q1800  . clopidogrel  75 mg Oral Q breakfast  . enoxaparin (LOVENOX) injection  30 mg Subcutaneous Q24H  . pantoprazole  40 mg Oral Q0600   Continuous: . sodium chloride 50 mL/hr at 12/18/12 2307    Assessment/Plan: Mental status change characterized by aphasia.  Patient did in fact have a CVA however does not appear to correlate with his deficits. Await further input from neurology continue PT OT speech, although unlikely skilled nursing facility will be indicated Diabetes well-controlled Hypercholesterolemia well-controlled  LOS: 3 days   Clarabelle Oscarson D 12/21/2012, 8:14 AM

## 2012-12-21 NOTE — Progress Notes (Signed)
  Echocardiogram 2D Echocardiogram has been performed.  [name] [name] 12/21/2012, 11:06 AM

## 2012-12-21 NOTE — Progress Notes (Signed)
Occupational Therapy Treatment Patient Details Name: [name] [name] MRN: [id number] DOB: [birth year] Today's Date: 12/21/2012 Time: 9147-8295 OT Time Calculation (min): 17 min  OT Assessment / Plan / Recommendation Comments on Treatment Session  Pt requires max encouragement to participate today due to lehtargy (sleeping upon therapist's arrival).  Recommend SNF    Follow Up Recommendations  SNF;Supervision/Assistance - 24 hour    Barriers to Discharge       Equipment Recommendations  3 in 1 bedside comode;Other (comment)    Recommendations for Other Services    Frequency Min 2X/week   Plan Discharge plan remains appropriate    Precautions / Restrictions Precautions Precautions: Fall Precaution Comments: h/o falls inside and outside at home, not very frequent.        ADL  Toilet Transfer: Simulated;Minimal assistance Toilet Transfer Method: Sit to stand Toilet Transfer Equipment: Comfort height toilet Transfers/Ambulation Related to ADLs: min A.   ADL Comments: Pt sleeping soundly upon therapist's enterance.  Pt requires max encouragement to participate.  Pt. would not particiapte in any functional activity other than ambulation.  Pt. required mod verbal and gestural cues to navigate in hallway    OT Diagnosis:    OT Problem List:   OT Treatment Interventions:     OT Goals Acute Rehab OT Goals Time For Goal Achievement: 01/03/13 ADL Goals Pt Will Perform Grooming: with supervision;Standing at sink;Unsupported Pt Will Perform Upper Body Bathing: with supervision;Standing at sink;Unsupported Pt Will Perform Upper Body Dressing: with supervision;Sitting, chair;Unsupported Pt Will Transfer to Toilet: with supervision;Ambulation;3-in-1 ADL Goal: Toilet Transfer - Progress: Progressing toward goals Pt Will Perform Toileting - Clothing Manipulation: with supervision;Sitting on 3-in-1 or toilet Pt Will Perform Toileting - Hygiene: with supervision;Sit to stand from  3-in-1/toilet Miscellaneous OT Goals Miscellaneous OT Goal #1: Pt will complete bed mobility Min guard (A)  OT Goal: Miscellaneous Goal #1 - Progress: Progressing toward goals Miscellaneous OT Goal #2: Pt will locate ADL objects on right side 2 out 4 trials during task without cues  Visit Information  Last OT Received On: 12/21/12 Assistance Needed: +1    Subjective Data      Prior Functioning       Cognition  Cognition Arousal/Alertness: Lethargic Behavior During Therapy: Flat affect Overall Cognitive Status: Impaired/Different from baseline Area of Impairment: Attention;Following commands;Safety/judgement;Awareness Current Attention Level: Sustained Following Commands: Follows one step commands inconsistently Safety/Judgement: Decreased awareness of safety;Decreased awareness of deficits Awareness: Emergent;Anticipatory Problem Solving: Slow processing;Decreased initiation;Requires verbal cues;Requires tactile cues General Comments: pt impulsive & with poor safety awareness & need for assistance.  Pt pushing therapists' hand away & stating "don't touch me" when standing up & ambulating, as well as he would not use an AD but was very unsteady without.      Mobility  Bed Mobility Bed Mobility: Supine to Sit;Sitting - Scoot to [place] of Bed;Sit to Supine Supine to Sit: 4: Min assist;HOB elevated Sitting - Scoot to Edge of Bed: 5: Supervision Sit to Supine: 5: Supervision;HOB flat Details for Bed Mobility Assistance: Assist to move to EOB due to pt resistive Transfers Transfers: Stand to Sit;Sit to Stand Sit to Stand: 4: Min assist;With upper extremity assist;From bed Stand to Sit: 4: Min assist;With upper extremity assist;To bed Details for Transfer Assistance: assist for balance, and tactile/gestural cues for safety    Exercises      Balance     End of Session    GO     Jeani Hawking M 12/21/2012, 3:49 PM

## 2012-12-22 DIAGNOSIS — R1310 Dysphagia, unspecified: Secondary | ICD-10-CM | POA: Diagnosis not present

## 2012-12-22 DIAGNOSIS — I6350 Cerebral infarction due to unspecified occlusion or stenosis of unspecified cerebral artery: Secondary | ICD-10-CM | POA: Diagnosis not present

## 2012-12-22 DIAGNOSIS — E119 Type 2 diabetes mellitus without complications: Secondary | ICD-10-CM | POA: Diagnosis not present

## 2012-12-22 DIAGNOSIS — R4701 Aphasia: Secondary | ICD-10-CM | POA: Diagnosis not present

## 2012-12-22 DIAGNOSIS — R4182 Altered mental status, unspecified: Secondary | ICD-10-CM | POA: Diagnosis not present

## 2012-12-22 MED ORDER — CLOPIDOGREL BISULFATE 75 MG PO TABS: 75.0000 mg | ORAL_TABLET | Freq: Every day | ORAL | Status: AC

## 2012-12-22 NOTE — Progress Notes (Signed)
Pt. Being discharged home, family is aware that physician and therapies feel patient would be better at a skilled nursing facility.  Discharge instructions reviewed with patients family, and wife.  Home Health services have been arranged.  Discharge instructions given  Wife and family.

## 2012-12-22 NOTE — Progress Notes (Signed)
Speech Language Pathology Treatment: Dysphagia and Cognition Patient Details Name: [name] [name] MRN: [id number] DOB: 1923/07/18 Today's Date: 12/22/2012 Time: 3016-0109 SLP Time Calculation (min): 20 min  Assessment / Plan / Recommendation Clinical Impression  Treatment focused on both facilitation fo communication and diet tolerance. Patient initially calm with eyes open, responding to clinician biographical questions primarily with "what?" and " i dont know today". Increased spontaneous and appropriate verbal output noted as patient became very combative when SLP attempting to adjust hearing aid and CNA attempting to take blood for blood sugar check, requiring max verbal, tactile, and visual cues to calm. Speaking loudly in right ear and with visual cueing, patient able to appropriately nod head yes in response to questions in 2/2 attempts. Self feeding of pm meal functional once calm and patient without overt indication of dysphagia. Oral phase mildly delayed suspected due to missing dentition. No overt indication of aspiration observed. No further f/u for swallowing warranted at this time as patient appears to be tolerating current diet which remains appropriate given lack of dentition. Continues to be   difficult to ascertain impact of hearing loss on communication deficits, but suspect it is primary barrier given site of neuro lesion, which is unlikely to result in an aphasia.  SLP to continue to follow for facilitation of improved communication for improved function and safety.       SLP Plan  Continue with current plan of care    Pertinent Vitals/Pain None observed  SLP Goals  SLP Goals Potential to Achieve Goals: Fair Potential Considerations: Ability to learn/carryover information;Severity of impairments SLP Goal #1: Pt will follow one step commands with 80% accuracy during functional tasks with max assist.  SLP Goal #1 - Progress: Progressing toward goal SLP Goal #2: Pt will  identify objects used daily from choice of two with 80% accuracy and max assist.  SLP Goal #2 - Progress: Progressing toward goal SLP Goal #3: Pt will participate in automatic speech tasks with 80% accuracy and max assist to maximize expressive language rehabiliation.  SLP Goal #3 - Progress: Progressing toward goal SLP Goal #4: Pt will answer basic y/n questions re: current environment with 80% accuracy and total assist.  SLP Goal #4 - Progress: Progressing toward goal  General Temperature Spikes Noted: No Respiratory Status: Room air Behavior/Cognition: Alert;Hard of hearing;Decreased sustained attention;Doesn't follow directions;Requires cueing;Agitated Oral Cavity - Dentition: Dentures, top;Poor condition Patient Positioning: Upright in bed      Treatment Treatment focused on: Aphasia;Cognition (dysphagia) Skilled Treatment: Treatment focused on both facilitation fo communication and diet tolerance. Patient initially calm with eyes open, responding to clinician biographical questions primarily with "what?" and " i dont know today". Increased spontaneous and appropriate verbal output noted as patient became very combative when SLP attempting to adjust hearing aid and CNA attempting to take blood for blood sugar check, requiring max verbal, tactile, and visual cues to calm. Speaking loudly in right ear and with visual cueing, patient able to appropriately nod head yes in response to questions in 2/2 attempts. Self feeding of pm meal functional once calm and patient without overt indication of dysphagia. Oral phase mildly delayed suspected due to missing dentition. No overt indication of aspiration observed. No further f/u for swallowing warranted at this time as patient appears to be tolerating current diet which remains appropriate given lack of dentition. Continues to be   difficult to ascertain impact of hearing loss on communication deficits, but suspect it is primary barrier given site of  neuro lesion, which is unlikely to result in an aphasia.  SLP to continue to follow for facilitation of improved communication for improved function and safety.      GO   Ferdinand Lango MA, CCC-SLP [phone]   Ferdinand Lango Meryl 12/22/2012, 1:43 PM

## 2012-12-22 NOTE — Progress Notes (Signed)
I have been consulted by PTA, read and agree with modifications in pt's Physical Therapy treatment/goals.   Aerianna Losey L. Chilton Si, DPT   Pager [phone]     Cell [phone]

## 2012-12-22 NOTE — Care Management Note (Signed)
    Page 1 of 2   12/26/2012     8:50:48 AM   CARE MANAGEMENT NOTE 12/26/2012  Patient:  [name] [name], [name] [name]   Account Number:  [id number]  Date Initiated:  12/19/2012  Documentation initiated by:  [facility]  Subjective/Objective Assessment:   admitted with AMS, slurred speech, CVA workup     Action/Plan:   PT/OT evals-recommended SNF but family wants to take patent home with [facility]   Anticipated DC Date:  12/22/2012   Anticipated DC Plan:  HOME W HOME HEALTH SERVICES  In-house referral  Clinical Social Worker      DC Planning Services  CM consult      Choice offered to / List presented to:  C-3 Spouse        HH arranged  HH-1 RN  HH-2 PT  HH-3 OT      [facility] agency  [facility]   Status of service:  Completed, signed off Medicare Important Message given?   (If response is "NO", the following Medicare IM given date fields will be blank) Date Medicare IM given:   Date Additional Medicare IM given:    Discharge Disposition:  HOME W HOME HEALTH SERVICES  Per UR Regulation:  Reviewed for med. necessity/level of care/duration of stay  If discussed at Long Length of Stay Meetings, dates discussed:    Comments:  12/22/12 Patient's wife decided that she would rather take patient back home  than d/c to SNF. Patient lives with 2 sons and an adult grandson in addition to wife.He will have 24/7 assistance from family in addition to his wife. Spoke with patent's wife, daughter and son about Hc. They selected [facility] from [facility] list.Contacted Marie at [facility], they will work with patient, orders pending. Cm will continue to follow. Jacquelynn Cree RN, BSN, CCM

## 2012-12-22 NOTE — Progress Notes (Signed)
PT Cancellation Note  Patient Details Name: [name] [name] MRN: [id number] DOB: 1923-04-02   Cancelled Treatment:    Reason Eval/Treat Not Completed: Fatigue/lethargy limiting ability to participate.  Patient somewhat agitated, refusing PT.  Attempted to see patient x2.   Vena Austria 12/22/2012, 6:20 PM

## 2012-12-22 NOTE — Discharge Summary (Signed)
Physician Discharge Summary  Patient ID: [name] [name] MRN: [id number] DOB/AGE: 1923/02/18 79 y.o.  Admit date: 12/18/2012 Discharge date: 12/22/2012  Admission Diagnoses:  Discharge Diagnoses:  Active Problems:   DM   PULMONARY FIBROSIS ILD POST INFLAMMATORY CHRONIC   Aphasia   Discharged Condition: poor  Hospital Course:   Patient was admitted to the hospital with mental status change characterized by aphasia. Evaluation in the hospital revealed small left occipital stroke. In general he did not follow any commands. He was seen by PT, OT, neurology. There was concern for some underlying dementia. Skilled nursing facility was recommended however patient family ultimately refused. Was impossible to perform a neuro exam is patient did not follow any commands however he grossly moves all extremities. He was seen at time self-feeding without difficulty. He was changed to a dysphagia 3 diet.   Consults:   Neurology    Significant Diagnostic Studies:Dg Chest 2 View  12/18/2012   *RADIOLOGY REPORT*  Clinical Data: Chest pain and stroke symptoms.  CHEST - 2 VIEW  Comparison: 09/04/2009.  Findings: Very poor inspiration.  Borderline enlarged cardiac silhouette.  Clear lungs.  No significant change in mild prominence of the pulmonary interstitial markings.  Thoracic spine degenerative changes.  IMPRESSION: No acute abnormality.  Very poor inspiration with grossly stable mild chronic interstitial lung disease.   Original Report Authenticated By: Beckie Salts, M.D.   Ct Head Wo Contrast  12/18/2012   *RADIOLOGY REPORT*  Clinical Data: Onset of slurred speech, repeating himself, holding head in hands, history diabetes, hypercholesterolemia, unable to follow commands  CT HEAD WITHOUT CONTRAST  Technique:  Contiguous axial images were obtained from the base of the skull through the vertex without contrast.  Comparison: None  Findings: Generalized atrophy. Normal ventricular morphology. No midline  shift or mass effect. Small vessel chronic ischemic changes of deep cerebral white matter. No intracranial hemorrhage, mass lesion or evidence of acute infarction. No extra-axial fluid collections. Bones and sinuses unremarkable. Extensive atherosclerotic calcifications at skull base.  IMPRESSION: Atrophy with small vessel chronic ischemic changes of deep cerebral white matter. No acute intracranial abnormalities.   Original Report Authenticated By: Ulyses Southward, M.D.   Mr Brain Wo Contrast  12/19/2012   *RADIOLOGY REPORT*  Clinical Data:  Mental status changes.  Slurred speech.  Diabetic.  MRI BRAIN WITHOUT CONTRAST MRA HEAD WITHOUT CONTRAST  Technique: Multiplanar, multiecho pulse sequences of the brain and surrounding structures were obtained according to standard protocol without intravenous contrast.  Angiographic images of the head were obtained using MRA technique without contrast.  Comparison: 12/18/2012 head CT.  No comparison brain MR.  MRI HEAD  Findings:  Very small acute non hemorrhagic left occipital lobe infarct.  Remote small cerebellar infarcts.  Mild to moderate small vessel disease type changes.  Global atrophy without hydrocephalus.  No intracranial hemorrhage.  No intracranial mass lesion detected on this unenhanced exam.  Transverse ligament hypertrophy.  Cervical medullary junction, pituitary region, pineal region and orbital structures unremarkable.  Minimal polypoid opacification inferior aspect left maxillary sinus air  IMPRESSION: Very small acute non hemorrhagic left occipital lobe infarct.  Remote small cerebellar infarcts.  Mild to moderate small vessel disease type changes.  Global atrophy without hydrocephalus.  MRA HEAD  Findings: Decreased signal right internal carotid artery pre cavernous and cavernous segment may be related to artifact.  Decreased number of visualized right middle cerebral artery branches suggestive of a prominent atherosclerotic type changes.  Ectatic A2 segment  right anterior cerebral artery.  Codominant vertebral arteries.  No high-grade stenosis of the distal vertebral arteries or basilar artery.  Mild to moderate irregularity and narrowing involving portions of the PICA bilaterally.  Nonvisualization of the AICA bilaterally.  High-grade stenosis P2 segment posterior cerebral artery bilaterally.  Posterior cerebral artery branch vessel irregularity greater on the left.  Superior cerebral artery irregularity and narrowing.  No aneurysm noted.  IMPRESSION: Decreased number of visualized right middle cerebral artery branches suggestive of a prominent atherosclerotic type changes.  Decreased signal right internal carotid artery pre cavernous and cavernous segment may be related to artifact. Overall evaluation of this level is therefore limited.  High-grade stenosis P2 segment posterior cerebral artery bilaterally.  Posterior cerebral artery branch vessel irregularity greater on the left.  Please see above.  This has been made a PRA call report utilizing dashboard call feature.   Original Report Authenticated By: Lacy Duverney, M.D.   Mr Mra Head/brain Wo Cm  12/19/2012   *RADIOLOGY REPORT*  Clinical Data:  Mental status changes.  Slurred speech.  Diabetic.  MRI BRAIN WITHOUT CONTRAST MRA HEAD WITHOUT CONTRAST  Technique: Multiplanar, multiecho pulse sequences of the brain and surrounding structures were obtained according to standard protocol without intravenous contrast.  Angiographic images of the head were obtained using MRA technique without contrast.  Comparison: 12/18/2012 head CT.  No comparison brain MR.  MRI HEAD  Findings:  Very small acute non hemorrhagic left occipital lobe infarct.  Remote small cerebellar infarcts.  Mild to moderate small vessel disease type changes.  Global atrophy without hydrocephalus.  No intracranial hemorrhage.  No intracranial mass lesion detected on this unenhanced exam.  Transverse ligament hypertrophy.  Cervical medullary junction,  pituitary region, pineal region and orbital structures unremarkable.  Minimal polypoid opacification inferior aspect left maxillary sinus air  IMPRESSION: Very small acute non hemorrhagic left occipital lobe infarct.  Remote small cerebellar infarcts.  Mild to moderate small vessel disease type changes.  Global atrophy without hydrocephalus.  MRA HEAD  Findings: Decreased signal right internal carotid artery pre cavernous and cavernous segment may be related to artifact.  Decreased number of visualized right middle cerebral artery branches suggestive of a prominent atherosclerotic type changes.  Ectatic A2 segment right anterior cerebral artery.  Codominant vertebral arteries.  No high-grade stenosis of the distal vertebral arteries or basilar artery.  Mild to moderate irregularity and narrowing involving portions of the PICA bilaterally.  Nonvisualization of the AICA bilaterally.  High-grade stenosis P2 segment posterior cerebral artery bilaterally.  Posterior cerebral artery branch vessel irregularity greater on the left.  Superior cerebral artery irregularity and narrowing.  No aneurysm noted.  IMPRESSION: Decreased number of visualized right middle cerebral artery branches suggestive of a prominent atherosclerotic type changes.  Decreased signal right internal carotid artery pre cavernous and cavernous segment may be related to artifact. Overall evaluation of this level is therefore limited.  High-grade stenosis P2 segment posterior cerebral artery bilaterally.  Posterior cerebral artery branch vessel irregularity greater on the left.  Please see above.  This has been made a PRA call report utilizing dashboard call feature.   Original Report Authenticated By: Lacy Duverney, M.D.   CLINICALLY SIGNIFICANT STUDIES  Basic Metabolic Panel:   Recent Labs  Lab  12/18/12 1605  12/18/12 2306   NA  138  --   K  4.1  --   CL  104  --   CO2  23  --   GLUCOSE  115*  --   BUN  12  --  CREATININE  0.90  0.84    CALCIUM  8.6  --    Liver Function Tests:   Recent Labs  Lab  12/18/12 1605   AST  16   ALT  9   ALKPHOS  51   BILITOT  0.7   PROT  6.3   ALBUMIN  3.5    CBC:   Recent Labs  Lab  12/18/12 1605  12/18/12 2306   WBC  9.2  9.2   HGB  13.8  13.9   HCT  39.5  39.4   MCV  85.3  84.7   PLT  121*  126*    Coagulation:   Recent Labs  Lab  12/18/12 1605   LABPROT  13.5   INR  1.04    Cardiac Enzymes:   Recent Labs  Lab  12/18/12 2019  12/19/12 0127  12/19/12 0930   TROPONINI  <0.30  <0.30  <0.30    Urinalysis:   Recent Labs  Lab  12/18/12 2251   COLORURINE  YELLOW   LABSPEC  1.020   PHURINE  6.5   GLUCOSEU  NEGATIVE   HGBUR  NEGATIVE   BILIRUBINUR  NEGATIVE   KETONESUR  40*   PROTEINUR  NEGATIVE   UROBILINOGEN  1.0   NITRITE  NEGATIVE   LEUKOCYTESUR  NEGATIVE    Lipid Panel    Component  Value  Date/Time    CHOL  129  12/19/2012 0127    TRIG  91  12/19/2012 0127    HDL  26*  12/19/2012 0127    CHOLHDL  5.0  12/19/2012 0127    VLDL  18  12/19/2012 0127    LDLCALC  85  12/19/2012 0127    HgbA1C  Lab Results   Component  Value  Date    HGBA1C  6.0*  12/19/2012    Urine Drug Screen:  No results found for this basename: labopia, cocainscrnur, labbenz, amphetmu, thcu, labbarb    Alcohol Level: No results found for this basename: ETH, in the last 168 hours  MRI HEAD 12/19/2012 Very small acute non hemorrhagic left occipital lobe infarct. Remote small cerebellar infarcts. Mild to moderate small vessel disease type changes. Global atrophy without hydrocephalus.  MRA HEAD Decreased number of visualized right middle cerebral artery branches suggestive of a prominent atherosclerotic type changes. Decreased signal right internal carotid artery pre cavernous and cavernous segment may be related to artifact. Overall evaluation of this level is therefore limited. High-grade stenosis P2 segment posterior cerebral artery bilaterally. Posterior cerebral artery branch  vessel irregularity greater on the left.  CT of the brain 12/18/2012 Atrophy with small vessel chronic ischemic changes of deep cerebral white matter. No acute intracranial abnormalities  2D Echocardiogram Pattern of moderate LVH. Systolic function was normal. The estimated ejection fraction was in the range of 50% to 55%. no source of embolus.  Carotid Doppler Technically difficult due to tortuosity and highbifurcation. No obvious significant extracranial carotid artery stenosis demonstrated. Vertebrals are patent with antegrade flow.  CXR No acute abnormality. Very poor inspiration with grossly stable mild chronic interstitial lung disease.  EKG normal sinus rhythm     Discharge Exam: Blood pressure 148/69, pulse 77, temperature 97.5 F (36.4 C), temperature source Oral, resp. rate 20, height 5\' 10"  (1.778 m), weight 93.3 kg (205 lb 11 oz), SpO2 97.00%. General appearance: no apparent distress Resp: clear to auscultation bilaterally Cardio: regular rate and rhythm, S1, S2 normal, no murmur, click, rub or gallop Extremities:  extremities normal, atraumatic, no cyanosis or edema Neurologic: Motor: gross moves all extremities Patient would not follow any specific commands  Disposition:  Home with services per patient's wife requests. She is aware the skilled nursing facility was recommended because of patient's poor safety awareness    Medication List    STOP taking these medications       ibuprofen 200 MG tablet  Commonly known as:  ADVIL,MOTRIN      TAKE these medications       clopidogrel 75 MG tablet  Commonly known as:  PLAVIX  Take 1 tablet (75 mg total) by mouth daily with breakfast.     meloxicam 7.5 MG tablet  Commonly known as:  MOBIC  Take 7.5 mg by mouth daily.     pioglitazone 45 MG tablet  Commonly known as:  ACTOS  Take 45 mg by mouth daily.         SignedKaty Apo 12/22/2012, 5:06 PM

## 2012-12-22 NOTE — Progress Notes (Signed)
Subjective: Patient pleasant, no apparent distress. Still would not follow commands, neurology input appreciated  Objective: Vital signs in last 24 hours: Temp:  [97.1 F (36.2 C)-98.3 F (36.8 C)] 97.9 F (36.6 C) (05/23 0959) Pulse Rate:  [56-68] 56 (05/23 0959) Resp:  [18-20] 20 (05/23 0959) BP: (136-162)/(60-68) 148/66 mmHg (05/23 0959) SpO2:  [94 %-100 %] 96 % (05/23 0959) Weight change:  Last BM Date: 12/19/12  Intake/Output from previous day: 05/22 0701 - 05/23 0700 In: 600 [P.O.:600] Out: -  Intake/Output this shift: Total I/O In: 600 [P.O.:600] Out: -   General appearance: pleasant, no apparent distress Resp: clear to auscultation bilaterally Cardio: regular rate and rhythm Extremities: extremities normal, atraumatic, no cyanosis or edema Neurologic: Motor: grossly moves all extremities Patient will not follow any commands Lab Results:  Results for orders placed during the hospital encounter of 12/18/12 (from the past 24 hour(s))  GLUCOSE, CAPILLARY     Status: Abnormal   Collection Time    12/21/12  4:28 PM      Result Value Range   Glucose-Capillary 141 (*) 70 - 99 mg/dL  GLUCOSE, CAPILLARY     Status: Abnormal   Collection Time    12/21/12 10:17 PM      Result Value Range   Glucose-Capillary 125 (*) 70 - 99 mg/dL   2-D echo  Left ventricle: Wall thickness was increased in a pattern of moderate LVH. Systolic function was normal. The estimated ejection fraction was in the range of 50% to 55%. Images were inadequate for LV wall motion assessment. Doppler parameters are consistent with abnormal left ventricular relaxation (grade 1 diastolic dysfunction). Doppler parameters are consistent with high ventricular filling pressure.   Studies/Results: No results found.  Medications:  Prior to Admission:  Prescriptions prior to admission  Medication Sig Dispense Refill  . ibuprofen (ADVIL,MOTRIN) 200 MG tablet Take 200 mg by mouth once.      .  meloxicam (MOBIC) 7.5 MG tablet Take 7.5 mg by mouth daily.      . pioglitazone (ACTOS) 45 MG tablet Take 45 mg by mouth daily.       Scheduled: . atorvastatin  20 mg Oral q1800  . clopidogrel  75 mg Oral Q breakfast  . enoxaparin (LOVENOX) injection  40 mg Subcutaneous Q24H  . pantoprazole  40 mg Oral Q0600   Continuous: . sodium chloride 50 mL/hr at 12/18/12 2307    Assessment/Plan: Mental status change characterized by aphasia, refusal to follow commands in a patient with diabetes, high cholesterol. Imaging did reveal a small infarct left occipital region which is not cause of above deficits. Currently there is concern for some underlying cognitive dysfunction. PT OT recommend skilled nursing facility. Patient medically stable for discharge when bed available available  LOS: 4 days   [name] [name] D 12/22/2012, 12:20 PM

## 2012-12-22 NOTE — Progress Notes (Signed)
Stroke Team Progress Note  HISTORY [name] [name] is an 79 y.o. male, right handed, with a past medical history significant for DM, hypercholesterolemia, admitted to [facility] with new altered mental status and difficulty speaking.  He is described by his family as a very active and functional individual who was in his usual state of health until last Sunday when he became " kind of confused, holding his head with his hands, and not able to comprehend and speak properly". However, wife stated that at times he is able to speak clearly but not long sentences.   According to his wife he is " little bit forgetful and at times can get upset easily" but otherwise they report no significant changes in his condition.  CT brain showed no acute intracranial abnormality but MRI-DWI demonstrated a very small acute left occipital infarct. MRA brain significant for high grade stenosis P2 segment bilateral PCA's.    Date last known well: 12/18/12  Time last known well: uncertain  tPA Given: No, out of the window for thrombolytics.   SUBJECTIVE Wife and son Jorja Loa are at the bedside, along with sitter. Per son, patient is [facility] - he can speak clearly but is slow to respond if you ask him a question.  OBJECTIVE Most recent Vital Signs: Filed Vitals:   12/21/12 1835 12/21/12 2200 12/22/12 0200 12/22/12 0959  BP: 136/60 145/68 162/64 148/66  Pulse: 62 60 63 56  Temp: 98.1 F (36.7 C) 97.4 F (36.3 C) 98.3 F (36.8 C) 97.9 F (36.6 C)  TempSrc: Oral   Axillary  Resp: 18 18 18 20   Height:      Weight:      SpO2: 100% 99% 94% 96%   CBG (last 3)   Recent Labs  12/21/12 1201 12/21/12 1628 12/21/12 2217  GLUCAP 135* 141* 125*   IV Fluid Intake:   . sodium chloride 50 mL/hr at 12/18/12 2307   MEDICATIONS  . atorvastatin  20 mg Oral q1800  . clopidogrel  75 mg Oral Q breakfast  . enoxaparin (LOVENOX) injection  40 mg Subcutaneous Q24H  . pantoprazole  40 mg Oral Q0600   PRN:  acetaminophen, acetaminophen,  ondansetron (ZOFRAN) IV, senna-docusate  Diet:  Dysphagia 3 liquids Activity:  ambulate DVT Prophylaxis:  Lovenox  CLINICALLY SIGNIFICANT STUDIES Basic Metabolic Panel:   Recent Labs Lab 12/18/12 1605 12/18/12 2306  NA 138  --   K 4.1  --   CL 104  --   CO2 23  --   GLUCOSE 115*  --   BUN 12  --   CREATININE 0.90 0.84  CALCIUM 8.6  --    Liver Function Tests:   Recent Labs Lab 12/18/12 1605  AST 16  ALT 9  ALKPHOS 51  BILITOT 0.7  PROT 6.3  ALBUMIN 3.5   CBC:   Recent Labs Lab 12/18/12 1605 12/18/12 2306  WBC 9.2 9.2  HGB 13.8 13.9  HCT 39.5 39.4  MCV 85.3 84.7  PLT 121* 126*   Coagulation:   Recent Labs Lab 12/18/12 1605  LABPROT 13.5  INR 1.04   Cardiac Enzymes:   Recent Labs Lab 12/18/12 2019 12/19/12 0127 12/19/12 0930  TROPONINI <0.30 <0.30 <0.30   Urinalysis:   Recent Labs Lab 12/18/12 2251  COLORURINE YELLOW  LABSPEC 1.020  PHURINE 6.5  GLUCOSEU NEGATIVE  HGBUR NEGATIVE  BILIRUBINUR NEGATIVE  KETONESUR 40*  PROTEINUR NEGATIVE  UROBILINOGEN 1.0  NITRITE NEGATIVE  LEUKOCYTESUR NEGATIVE   Lipid Panel  Component Value Date/Time   CHOL 129 12/19/2012 0127   TRIG 91 12/19/2012 0127   HDL 26* 12/19/2012 0127   CHOLHDL 5.0 12/19/2012 0127   VLDL 18 12/19/2012 0127   LDLCALC 85 12/19/2012 0127   HgbA1C  Lab Results  Component Value Date   HGBA1C 6.0* 12/19/2012    Urine Drug Screen:   No results found for this basename: labopia,  cocainscrnur,  labbenz,  amphetmu,  thcu,  labbarb    Alcohol Level: No results found for this basename: ETH,  in the last 168 hours  MRI HEAD   12/19/2012  Very small acute non hemorrhagic left occipital lobe infarct.  Remote small cerebellar infarcts.  Mild to moderate small vessel disease type changes.  Global atrophy without hydrocephalus.    MRA HEAD  Decreased number of visualized right middle cerebral artery branches suggestive of a prominent atherosclerotic type changes.  Decreased signal  right internal carotid artery pre cavernous and cavernous segment may be related to artifact. Overall evaluation of this level is therefore limited.  High-grade stenosis P2 segment posterior cerebral artery bilaterally.  Posterior cerebral artery branch vessel irregularity greater on the left.    CT of the brain  12/18/2012 Atrophy with small vessel chronic ischemic changes of deep cerebral white matter. No acute intracranial abnormalities  2D Echocardiogram  Pattern of moderate LVH. Systolic function was normal. The estimated ejection fraction was in the range of 50% to 55%. no source of embolus.   Carotid Doppler  Technically difficult due to tortuosity and highbifurcation. No obvious significant extracranial carotid artery stenosis demonstrated. Vertebrals are patent with antegrade flow.  CXR  No acute abnormality. Very poor inspiration with grossly stable mild chronic interstitial lung disease.   EKG  normal sinus rhythm  Therapy Recommendations SNF recommended  Physical Exam   Mental Status:  Alert and awake. Disoriented to place, year, situation. His comprehension of spoken language seems top be impaired and he is not able to speak fluently at the time of this assessment.  Cranial Nerves:  II: Discs flat bilaterally; Visual fields grossly normal, pupils equal, round, reactive to light and accommodation  III,IV, VI: ptosis not present, extra-ocular motions intact bilaterally  V,VII: smile symmetric, facial light touch sensation normal bilaterally  VIII: hearing normal bilaterally  IX,X: gag reflex present  XI: bilateral shoulder shrug  XII: midline tongue extension  Motor:  Right : Upper extremity 5/5 Left: Upper extremity 5/5  Lower extremity 5/5 Lower extremity 5/5  Tone and bulk:normal tone throughout; no atrophy noted  Sensory: Pinprick and light touch intact throughout, bilaterally  Deep Tendon Reflexes: 2+ and symmetric throughout  Plantars:  Right: downgoing Left:  downgoing  Cerebellar:  Unable to perform as he doesn't follow commands properly.  Gait:  Not tested.  ASSESSMENT [name] [name] is a 79 y.o. male presenting with acute delirium, dysarthria. Imaging confirms small acute non hemorrhagic left occipital lobe infarct, felt to be thrombotic secondary to small vessel disease.  On no anticoagulants prior to admission. Now on clopidogrel 75 mg orally every day for secondary stroke prevention. Patient with resultant delirium. Work up completed.   Diabetes mellitus, type 2, controlled, hgb a1c 6.0  Hyperlipidemia. LDL 85, goal < 70 in diabetics, placed on atorvastatin  Long term medication use  nderlying dementia suspected prior to admission, though patient's significant loss of hearing may be the main culprit of behavior in the setting of his desire to go home.  Family struggling with decision SNF  vs home. Family meeting with SW this afternoon.  Hospital day # 4  TREATMENT/PLAN  Continue clopidogrel 75 mg orally every day for secondary stroke prevention.  Ongoing risk factor modification  Short term SNF most likely. Await family decision.  Signoff Sat if family has determined disposition  Annie Main, MSN, RN, ANVP-BC, ANP-BC, [facility] Pager: [phone] 12/22/2012 10:37 AM  I have personally obtained a history, examined the patient, evaluated imaging results, and formulated the assessment and plan of care. I agree with the above. Delia Heady, MD

## 2012-12-26 DIAGNOSIS — I69998 Other sequelae following unspecified cerebrovascular disease: Secondary | ICD-10-CM | POA: Diagnosis not present

## 2012-12-26 DIAGNOSIS — J8410 Pulmonary fibrosis, unspecified: Secondary | ICD-10-CM | POA: Diagnosis not present

## 2012-12-26 DIAGNOSIS — I69920 Aphasia following unspecified cerebrovascular disease: Secondary | ICD-10-CM | POA: Diagnosis not present

## 2012-12-26 DIAGNOSIS — M159 Polyosteoarthritis, unspecified: Secondary | ICD-10-CM | POA: Diagnosis not present

## 2012-12-26 DIAGNOSIS — H539 Unspecified visual disturbance: Secondary | ICD-10-CM | POA: Diagnosis not present

## 2012-12-26 DIAGNOSIS — E119 Type 2 diabetes mellitus without complications: Secondary | ICD-10-CM | POA: Diagnosis not present

## 2012-12-27 DIAGNOSIS — M159 Polyosteoarthritis, unspecified: Secondary | ICD-10-CM | POA: Diagnosis not present

## 2012-12-27 DIAGNOSIS — J8410 Pulmonary fibrosis, unspecified: Secondary | ICD-10-CM | POA: Diagnosis not present

## 2012-12-27 DIAGNOSIS — I69920 Aphasia following unspecified cerebrovascular disease: Secondary | ICD-10-CM | POA: Diagnosis not present

## 2012-12-27 DIAGNOSIS — E119 Type 2 diabetes mellitus without complications: Secondary | ICD-10-CM | POA: Diagnosis not present

## 2012-12-27 DIAGNOSIS — H539 Unspecified visual disturbance: Secondary | ICD-10-CM | POA: Diagnosis not present

## 2012-12-28 DIAGNOSIS — H539 Unspecified visual disturbance: Secondary | ICD-10-CM | POA: Diagnosis not present

## 2012-12-28 DIAGNOSIS — I69920 Aphasia following unspecified cerebrovascular disease: Secondary | ICD-10-CM | POA: Diagnosis not present

## 2012-12-28 DIAGNOSIS — J8410 Pulmonary fibrosis, unspecified: Secondary | ICD-10-CM | POA: Diagnosis not present

## 2012-12-28 DIAGNOSIS — M159 Polyosteoarthritis, unspecified: Secondary | ICD-10-CM | POA: Diagnosis not present

## 2012-12-28 DIAGNOSIS — E119 Type 2 diabetes mellitus without complications: Secondary | ICD-10-CM | POA: Diagnosis not present

## 2013-01-01 DIAGNOSIS — N182 Chronic kidney disease, stage 2 (mild): Secondary | ICD-10-CM | POA: Diagnosis not present

## 2013-01-01 DIAGNOSIS — E1129 Type 2 diabetes mellitus with other diabetic kidney complication: Secondary | ICD-10-CM | POA: Diagnosis not present

## 2013-01-01 DIAGNOSIS — I679 Cerebrovascular disease, unspecified: Secondary | ICD-10-CM | POA: Diagnosis not present

## 2013-01-01 DIAGNOSIS — R4189 Other symptoms and signs involving cognitive functions and awareness: Secondary | ICD-10-CM | POA: Diagnosis not present

## 2013-01-01 DIAGNOSIS — E782 Mixed hyperlipidemia: Secondary | ICD-10-CM | POA: Diagnosis not present

## 2013-01-02 DIAGNOSIS — E119 Type 2 diabetes mellitus without complications: Secondary | ICD-10-CM | POA: Diagnosis not present

## 2013-01-02 DIAGNOSIS — I69998 Other sequelae following unspecified cerebrovascular disease: Secondary | ICD-10-CM | POA: Diagnosis not present

## 2013-01-02 DIAGNOSIS — J8410 Pulmonary fibrosis, unspecified: Secondary | ICD-10-CM | POA: Diagnosis not present

## 2013-01-02 DIAGNOSIS — I69920 Aphasia following unspecified cerebrovascular disease: Secondary | ICD-10-CM | POA: Diagnosis not present

## 2013-01-02 DIAGNOSIS — M159 Polyosteoarthritis, unspecified: Secondary | ICD-10-CM | POA: Diagnosis not present

## 2013-01-03 DIAGNOSIS — E119 Type 2 diabetes mellitus without complications: Secondary | ICD-10-CM | POA: Diagnosis not present

## 2013-01-03 DIAGNOSIS — M159 Polyosteoarthritis, unspecified: Secondary | ICD-10-CM | POA: Diagnosis not present

## 2013-01-03 DIAGNOSIS — J8410 Pulmonary fibrosis, unspecified: Secondary | ICD-10-CM | POA: Diagnosis not present

## 2013-01-03 DIAGNOSIS — I69920 Aphasia following unspecified cerebrovascular disease: Secondary | ICD-10-CM | POA: Diagnosis not present

## 2013-01-03 DIAGNOSIS — H539 Unspecified visual disturbance: Secondary | ICD-10-CM | POA: Diagnosis not present

## 2013-01-04 DIAGNOSIS — M159 Polyosteoarthritis, unspecified: Secondary | ICD-10-CM | POA: Diagnosis not present

## 2013-01-04 DIAGNOSIS — J8410 Pulmonary fibrosis, unspecified: Secondary | ICD-10-CM | POA: Diagnosis not present

## 2013-01-04 DIAGNOSIS — E119 Type 2 diabetes mellitus without complications: Secondary | ICD-10-CM | POA: Diagnosis not present

## 2013-01-04 DIAGNOSIS — H539 Unspecified visual disturbance: Secondary | ICD-10-CM | POA: Diagnosis not present

## 2013-01-04 DIAGNOSIS — I69920 Aphasia following unspecified cerebrovascular disease: Secondary | ICD-10-CM | POA: Diagnosis not present

## 2013-01-05 DIAGNOSIS — M159 Polyosteoarthritis, unspecified: Secondary | ICD-10-CM | POA: Diagnosis not present

## 2013-01-05 DIAGNOSIS — H539 Unspecified visual disturbance: Secondary | ICD-10-CM | POA: Diagnosis not present

## 2013-01-05 DIAGNOSIS — J8410 Pulmonary fibrosis, unspecified: Secondary | ICD-10-CM | POA: Diagnosis not present

## 2013-01-05 DIAGNOSIS — E119 Type 2 diabetes mellitus without complications: Secondary | ICD-10-CM | POA: Diagnosis not present

## 2013-01-05 DIAGNOSIS — I69920 Aphasia following unspecified cerebrovascular disease: Secondary | ICD-10-CM | POA: Diagnosis not present

## 2013-01-08 DIAGNOSIS — I69920 Aphasia following unspecified cerebrovascular disease: Secondary | ICD-10-CM | POA: Diagnosis not present

## 2013-01-08 DIAGNOSIS — H539 Unspecified visual disturbance: Secondary | ICD-10-CM | POA: Diagnosis not present

## 2013-01-08 DIAGNOSIS — J8410 Pulmonary fibrosis, unspecified: Secondary | ICD-10-CM | POA: Diagnosis not present

## 2013-01-08 DIAGNOSIS — E119 Type 2 diabetes mellitus without complications: Secondary | ICD-10-CM | POA: Diagnosis not present

## 2013-01-08 DIAGNOSIS — M159 Polyosteoarthritis, unspecified: Secondary | ICD-10-CM | POA: Diagnosis not present

## 2013-01-10 DIAGNOSIS — I69920 Aphasia following unspecified cerebrovascular disease: Secondary | ICD-10-CM | POA: Diagnosis not present

## 2013-01-10 DIAGNOSIS — E119 Type 2 diabetes mellitus without complications: Secondary | ICD-10-CM | POA: Diagnosis not present

## 2013-01-10 DIAGNOSIS — M159 Polyosteoarthritis, unspecified: Secondary | ICD-10-CM | POA: Diagnosis not present

## 2013-01-10 DIAGNOSIS — I69998 Other sequelae following unspecified cerebrovascular disease: Secondary | ICD-10-CM | POA: Diagnosis not present

## 2013-01-10 DIAGNOSIS — J8410 Pulmonary fibrosis, unspecified: Secondary | ICD-10-CM | POA: Diagnosis not present

## 2013-01-11 DIAGNOSIS — I69998 Other sequelae following unspecified cerebrovascular disease: Secondary | ICD-10-CM | POA: Diagnosis not present

## 2013-01-11 DIAGNOSIS — E119 Type 2 diabetes mellitus without complications: Secondary | ICD-10-CM | POA: Diagnosis not present

## 2013-01-11 DIAGNOSIS — M159 Polyosteoarthritis, unspecified: Secondary | ICD-10-CM | POA: Diagnosis not present

## 2013-01-11 DIAGNOSIS — I69920 Aphasia following unspecified cerebrovascular disease: Secondary | ICD-10-CM | POA: Diagnosis not present

## 2013-01-11 DIAGNOSIS — J8410 Pulmonary fibrosis, unspecified: Secondary | ICD-10-CM | POA: Diagnosis not present

## 2013-01-15 DIAGNOSIS — J8410 Pulmonary fibrosis, unspecified: Secondary | ICD-10-CM | POA: Diagnosis not present

## 2013-01-15 DIAGNOSIS — I69920 Aphasia following unspecified cerebrovascular disease: Secondary | ICD-10-CM | POA: Diagnosis not present

## 2013-01-15 DIAGNOSIS — H539 Unspecified visual disturbance: Secondary | ICD-10-CM | POA: Diagnosis not present

## 2013-01-15 DIAGNOSIS — E119 Type 2 diabetes mellitus without complications: Secondary | ICD-10-CM | POA: Diagnosis not present

## 2013-01-15 DIAGNOSIS — M159 Polyosteoarthritis, unspecified: Secondary | ICD-10-CM | POA: Diagnosis not present

## 2013-01-17 DIAGNOSIS — M159 Polyosteoarthritis, unspecified: Secondary | ICD-10-CM | POA: Diagnosis not present

## 2013-01-17 DIAGNOSIS — H539 Unspecified visual disturbance: Secondary | ICD-10-CM | POA: Diagnosis not present

## 2013-01-17 DIAGNOSIS — J8410 Pulmonary fibrosis, unspecified: Secondary | ICD-10-CM | POA: Diagnosis not present

## 2013-01-17 DIAGNOSIS — I69920 Aphasia following unspecified cerebrovascular disease: Secondary | ICD-10-CM | POA: Diagnosis not present

## 2013-01-17 DIAGNOSIS — E119 Type 2 diabetes mellitus without complications: Secondary | ICD-10-CM | POA: Diagnosis not present

## 2013-01-18 DIAGNOSIS — I69920 Aphasia following unspecified cerebrovascular disease: Secondary | ICD-10-CM | POA: Diagnosis not present

## 2013-01-18 DIAGNOSIS — M159 Polyosteoarthritis, unspecified: Secondary | ICD-10-CM | POA: Diagnosis not present

## 2013-01-18 DIAGNOSIS — E119 Type 2 diabetes mellitus without complications: Secondary | ICD-10-CM | POA: Diagnosis not present

## 2013-01-18 DIAGNOSIS — J8410 Pulmonary fibrosis, unspecified: Secondary | ICD-10-CM | POA: Diagnosis not present

## 2013-01-18 DIAGNOSIS — I69998 Other sequelae following unspecified cerebrovascular disease: Secondary | ICD-10-CM | POA: Diagnosis not present

## 2013-01-22 DIAGNOSIS — M159 Polyosteoarthritis, unspecified: Secondary | ICD-10-CM | POA: Diagnosis not present

## 2013-01-22 DIAGNOSIS — H539 Unspecified visual disturbance: Secondary | ICD-10-CM | POA: Diagnosis not present

## 2013-01-22 DIAGNOSIS — E119 Type 2 diabetes mellitus without complications: Secondary | ICD-10-CM | POA: Diagnosis not present

## 2013-01-22 DIAGNOSIS — I69920 Aphasia following unspecified cerebrovascular disease: Secondary | ICD-10-CM | POA: Diagnosis not present

## 2013-01-22 DIAGNOSIS — J8410 Pulmonary fibrosis, unspecified: Secondary | ICD-10-CM | POA: Diagnosis not present

## 2013-01-29 DIAGNOSIS — I679 Cerebrovascular disease, unspecified: Secondary | ICD-10-CM | POA: Diagnosis not present

## 2013-01-29 DIAGNOSIS — R413 Other amnesia: Secondary | ICD-10-CM | POA: Diagnosis not present

## 2013-01-30 DIAGNOSIS — I69920 Aphasia following unspecified cerebrovascular disease: Secondary | ICD-10-CM | POA: Diagnosis not present

## 2013-01-30 DIAGNOSIS — M159 Polyosteoarthritis, unspecified: Secondary | ICD-10-CM | POA: Diagnosis not present

## 2013-01-30 DIAGNOSIS — E119 Type 2 diabetes mellitus without complications: Secondary | ICD-10-CM | POA: Diagnosis not present

## 2013-01-30 DIAGNOSIS — J8410 Pulmonary fibrosis, unspecified: Secondary | ICD-10-CM | POA: Diagnosis not present

## 2013-01-30 DIAGNOSIS — H539 Unspecified visual disturbance: Secondary | ICD-10-CM | POA: Diagnosis not present

## 2013-04-30 DIAGNOSIS — E782 Mixed hyperlipidemia: Secondary | ICD-10-CM | POA: Diagnosis not present

## 2013-04-30 DIAGNOSIS — E119 Type 2 diabetes mellitus without complications: Secondary | ICD-10-CM | POA: Diagnosis not present

## 2013-04-30 DIAGNOSIS — R413 Other amnesia: Secondary | ICD-10-CM | POA: Diagnosis not present

## 2013-04-30 DIAGNOSIS — I679 Cerebrovascular disease, unspecified: Secondary | ICD-10-CM | POA: Diagnosis not present

## 2013-06-08 DIAGNOSIS — Z85828 Personal history of other malignant neoplasm of skin: Secondary | ICD-10-CM | POA: Diagnosis not present

## 2013-06-08 DIAGNOSIS — L821 Other seborrheic keratosis: Secondary | ICD-10-CM | POA: Diagnosis not present

## 2013-06-08 DIAGNOSIS — L570 Actinic keratosis: Secondary | ICD-10-CM | POA: Diagnosis not present

## 2013-07-30 DIAGNOSIS — D3130 Benign neoplasm of unspecified choroid: Secondary | ICD-10-CM | POA: Diagnosis not present

## 2013-07-30 DIAGNOSIS — E119 Type 2 diabetes mellitus without complications: Secondary | ICD-10-CM | POA: Diagnosis not present

## 2013-08-27 DIAGNOSIS — R413 Other amnesia: Secondary | ICD-10-CM | POA: Diagnosis not present

## 2013-08-27 DIAGNOSIS — I679 Cerebrovascular disease, unspecified: Secondary | ICD-10-CM | POA: Diagnosis not present

## 2013-08-27 DIAGNOSIS — K5900 Constipation, unspecified: Secondary | ICD-10-CM | POA: Diagnosis not present

## 2013-08-27 DIAGNOSIS — E782 Mixed hyperlipidemia: Secondary | ICD-10-CM | POA: Diagnosis not present

## 2013-08-27 DIAGNOSIS — E119 Type 2 diabetes mellitus without complications: Secondary | ICD-10-CM | POA: Diagnosis not present

## 2013-09-03 DIAGNOSIS — R413 Other amnesia: Secondary | ICD-10-CM | POA: Diagnosis not present

## 2013-09-08 ENCOUNTER — Encounter: Payer: Self-pay | Admitting: *Deleted

## 2013-10-19 DIAGNOSIS — N3941 Urge incontinence: Secondary | ICD-10-CM | POA: Diagnosis not present

## 2013-12-07 DIAGNOSIS — Z85828 Personal history of other malignant neoplasm of skin: Secondary | ICD-10-CM | POA: Diagnosis not present

## 2013-12-07 DIAGNOSIS — L821 Other seborrheic keratosis: Secondary | ICD-10-CM | POA: Diagnosis not present

## 2013-12-07 DIAGNOSIS — L570 Actinic keratosis: Secondary | ICD-10-CM | POA: Diagnosis not present

## 2014-01-04 DIAGNOSIS — I10 Essential (primary) hypertension: Secondary | ICD-10-CM | POA: Diagnosis not present

## 2014-01-04 DIAGNOSIS — R413 Other amnesia: Secondary | ICD-10-CM | POA: Diagnosis not present

## 2014-01-04 DIAGNOSIS — K5900 Constipation, unspecified: Secondary | ICD-10-CM | POA: Diagnosis not present

## 2014-01-04 DIAGNOSIS — E782 Mixed hyperlipidemia: Secondary | ICD-10-CM | POA: Diagnosis not present

## 2014-01-04 DIAGNOSIS — I679 Cerebrovascular disease, unspecified: Secondary | ICD-10-CM | POA: Diagnosis not present

## 2014-01-04 DIAGNOSIS — E119 Type 2 diabetes mellitus without complications: Secondary | ICD-10-CM | POA: Diagnosis not present

## 2014-06-04 DIAGNOSIS — Z23 Encounter for immunization: Secondary | ICD-10-CM | POA: Diagnosis not present

## 2014-06-04 DIAGNOSIS — E782 Mixed hyperlipidemia: Secondary | ICD-10-CM | POA: Diagnosis not present

## 2014-06-04 DIAGNOSIS — I679 Cerebrovascular disease, unspecified: Secondary | ICD-10-CM | POA: Diagnosis not present

## 2014-06-04 DIAGNOSIS — E119 Type 2 diabetes mellitus without complications: Secondary | ICD-10-CM | POA: Diagnosis not present

## 2014-06-04 DIAGNOSIS — Z1389 Encounter for screening for other disorder: Secondary | ICD-10-CM | POA: Diagnosis not present

## 2014-06-04 DIAGNOSIS — Z0000 Encounter for general adult medical examination without abnormal findings: Secondary | ICD-10-CM | POA: Diagnosis not present

## 2014-06-07 DIAGNOSIS — Z85828 Personal history of other malignant neoplasm of skin: Secondary | ICD-10-CM | POA: Diagnosis not present

## 2014-06-07 DIAGNOSIS — D692 Other nonthrombocytopenic purpura: Secondary | ICD-10-CM | POA: Diagnosis not present

## 2014-06-07 DIAGNOSIS — L821 Other seborrheic keratosis: Secondary | ICD-10-CM | POA: Diagnosis not present

## 2014-06-07 DIAGNOSIS — L720 Epidermal cyst: Secondary | ICD-10-CM | POA: Diagnosis not present

## 2014-06-07 DIAGNOSIS — L570 Actinic keratosis: Secondary | ICD-10-CM | POA: Diagnosis not present

## 2014-08-06 DIAGNOSIS — E119 Type 2 diabetes mellitus without complications: Secondary | ICD-10-CM | POA: Diagnosis not present

## 2014-08-06 DIAGNOSIS — D3122 Benign neoplasm of left retina: Secondary | ICD-10-CM | POA: Diagnosis not present

## 2014-08-06 DIAGNOSIS — H04123 Dry eye syndrome of bilateral lacrimal glands: Secondary | ICD-10-CM | POA: Diagnosis not present

## 2014-08-07 DIAGNOSIS — N3941 Urge incontinence: Secondary | ICD-10-CM | POA: Diagnosis not present

## 2014-09-02 ENCOUNTER — Encounter: Payer: Self-pay | Admitting: Podiatry

## 2014-09-02 ENCOUNTER — Ambulatory Visit (INDEPENDENT_AMBULATORY_CARE_PROVIDER_SITE_OTHER): Payer: Medicare Other | Admitting: Podiatry

## 2014-09-02 VITALS — BP 180/81 | HR 52 | Resp 13 | Ht 70.5 in | Wt 212.0 lb

## 2014-09-02 DIAGNOSIS — B351 Tinea unguium: Secondary | ICD-10-CM | POA: Diagnosis not present

## 2014-09-02 NOTE — Patient Instructions (Signed)
Diabetes and Foot Care Diabetes may cause you to have problems because of poor blood supply (circulation) to your feet and legs. This may cause the skin on your feet to become thinner, break easier, and heal more slowly. Your skin may become dry, and the skin may peel and crack. You may also have nerve damage in your legs and feet causing decreased feeling in them. You may not notice minor injuries to your feet that could lead to infections or more serious problems. Taking care of your feet is one of the most important things you can do for yourself.  HOME CARE INSTRUCTIONS  Wear shoes at all times, even in the house. Do not go barefoot. Bare feet are easily injured.  Check your feet daily for blisters, cuts, and redness. If you cannot see the bottom of your feet, use a mirror or ask someone for help.  Wash your feet with warm water (do not use hot water) and mild soap. Then pat your feet and the areas between your toes until they are completely dry. Do not soak your feet as this can dry your skin.  Apply a moisturizing lotion or petroleum jelly (that does not contain alcohol and is unscented) to the skin on your feet and to dry, brittle toenails. Do not apply lotion between your toes.  Trim your toenails straight across. Do not dig under them or around the cuticle. File the edges of your nails with an emery board or nail file.  Do not cut corns or calluses or try to remove them with medicine.  Wear clean socks or stockings every day. Make sure they are not too tight. Do not wear knee-high stockings since they may decrease blood flow to your legs.  Wear shoes that fit properly and have enough cushioning. To break in new shoes, wear them for just a few hours a day. This prevents you from injuring your feet. Always look in your shoes before you put them on to be sure there are no objects inside.  Do not cross your legs. This may decrease the blood flow to your feet.  If you find a minor scrape,  cut, or break in the skin on your feet, keep it and the skin around it clean and dry. These areas may be cleansed with mild soap and water. Do not cleanse the area with peroxide, alcohol, or iodine.  When you remove an adhesive bandage, be sure not to damage the skin around it.  If you have a wound, look at it several times a day to make sure it is healing.  Do not use heating pads or hot water bottles. They may burn your skin. If you have lost feeling in your feet or legs, you may not know it is happening until it is too late.  Make sure your health care provider performs a complete foot exam at least annually or more often if you have foot problems. Report any cuts, sores, or bruises to your health care provider immediately. SEEK MEDICAL CARE IF:   You have an injury that is not healing.  You have cuts or breaks in the skin.  You have an ingrown nail.  You notice redness on your legs or feet.  You feel burning or tingling in your legs or feet.  You have pain or cramps in your legs and feet.  Your legs or feet are numb.  Your feet always feel cold. SEEK IMMEDIATE MEDICAL CARE IF:   There is increasing redness,   swelling, or pain in or around a wound.  There is a red line that goes up your leg.  Pus is coming from a wound.  You develop a fever or as directed by your health care provider.  You notice a bad smell coming from an ulcer or wound. Document Released: 07/16/2000 Document Revised: 03/21/2013 Document Reviewed: 12/26/2012 [employer] Patient Information 2015 [employer]. This information is not intended to replace advice given to you by your health care provider. Make sure you discuss any questions you have with your health care provider.  

## 2014-09-02 NOTE — Progress Notes (Signed)
   Subjective:    Patient ID: [name] [name], male    DOB: 12/02/1922, 81 y.o.   MRN: [id number]  HPI Comments: N debridement L 10 toenails D and O long-term C elongated, thickened toenails A diabetic pt, difficult to clip T none   Diabetes      Review of Systems  All other systems reviewed and are negative.      Objective:   Physical Exam   Orientated 3   Vascular: DP pulses 2/4 bilaterally PT pulse left 1/4 PT pulse right 2/4 Capillary reflex immediate bilaterally  Neurological: Sensation to 10 g monofilament wire intact 0/5 right and 3/5 left Vibratory sensation nonreactive bilaterally Ankle reflex equal and reactive bilaterally  Dermatological: The toenails are edema he elongated, incurvated, discolored, hypertrophic (neglected)  6-10  Musculoskeletal: No deformities noted       Assessment & Plan:   Assessment: Diminished posterior tibial pulse left Diabetic peripheral neuropathy Onychomycoses 6-10  Reappoint 3 months

## 2014-12-03 DIAGNOSIS — N3941 Urge incontinence: Secondary | ICD-10-CM | POA: Diagnosis not present

## 2014-12-03 DIAGNOSIS — R350 Frequency of micturition: Secondary | ICD-10-CM | POA: Diagnosis not present

## 2014-12-06 DIAGNOSIS — Z85828 Personal history of other malignant neoplasm of skin: Secondary | ICD-10-CM | POA: Diagnosis not present

## 2014-12-06 DIAGNOSIS — L820 Inflamed seborrheic keratosis: Secondary | ICD-10-CM | POA: Diagnosis not present

## 2014-12-06 DIAGNOSIS — L570 Actinic keratosis: Secondary | ICD-10-CM | POA: Diagnosis not present

## 2014-12-06 DIAGNOSIS — L812 Freckles: Secondary | ICD-10-CM | POA: Diagnosis not present

## 2014-12-06 DIAGNOSIS — L821 Other seborrheic keratosis: Secondary | ICD-10-CM | POA: Diagnosis not present

## 2014-12-06 DIAGNOSIS — D1801 Hemangioma of skin and subcutaneous tissue: Secondary | ICD-10-CM | POA: Diagnosis not present

## 2014-12-16 DIAGNOSIS — E139 Other specified diabetes mellitus without complications: Secondary | ICD-10-CM | POA: Diagnosis not present

## 2014-12-16 DIAGNOSIS — R413 Other amnesia: Secondary | ICD-10-CM | POA: Diagnosis not present

## 2014-12-16 DIAGNOSIS — E782 Mixed hyperlipidemia: Secondary | ICD-10-CM | POA: Diagnosis not present

## 2014-12-16 DIAGNOSIS — I639 Cerebral infarction, unspecified: Secondary | ICD-10-CM | POA: Diagnosis not present

## 2014-12-25 IMAGING — CT CT HEAD W/O CM
1 of 2 series · 16 of 30 positions shown, 20 images · non-contrast
Comparison: None

CLINICAL DATA: Onset of slurred speech, repeating himself, holding
head in hands, history diabetes, hypercholesterolemia, unable to
follow commands

CT HEAD WITHOUT CONTRAST
TECHNIQUE: Contiguous axial images were obtained from the base of
the skull through the vertex without contrast.

[Series 2: (id) head 4.8 h37s st · axial · 0.54mm/px · z∈[+1375,+1535]mm · 16 of 36 slices shown, 20 images]
[im 2/36  brain]
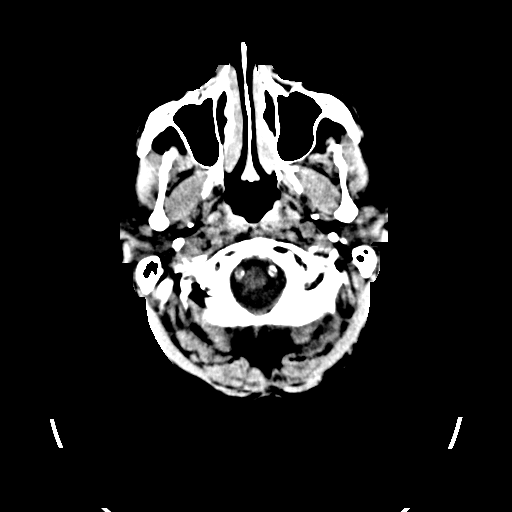
[im 2/36  bone]
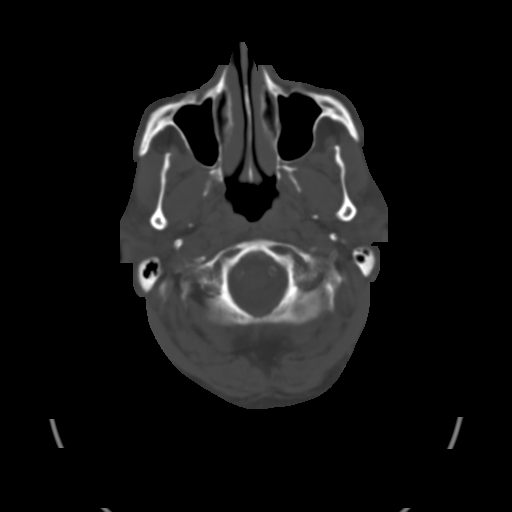
[im 4/36  brain]
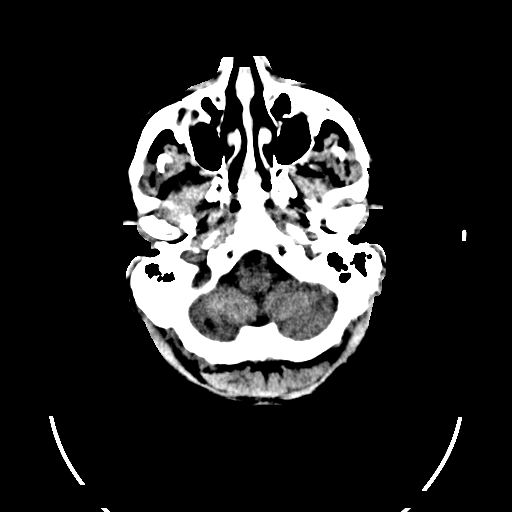
[im 6/36  brain]
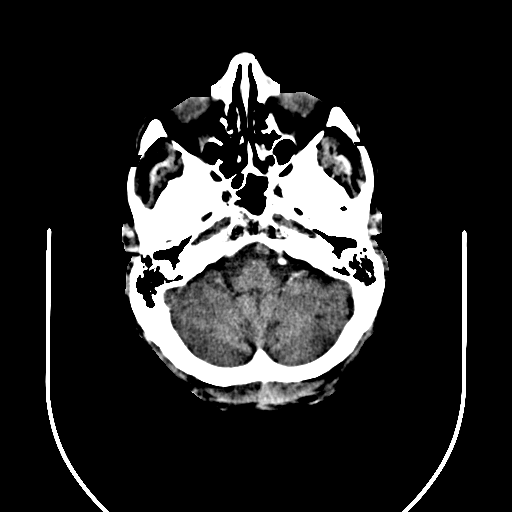
[im 8/36  brain]
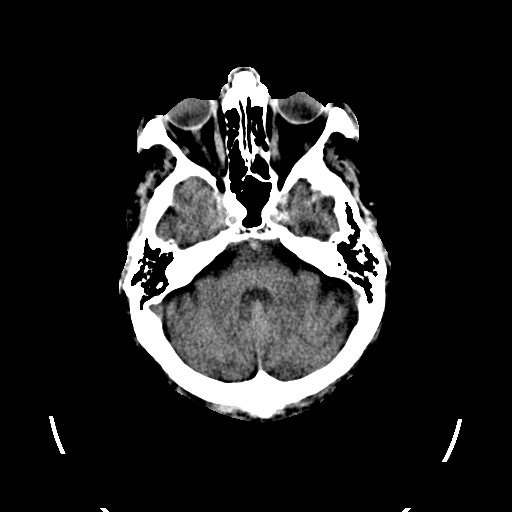
[im 12/36  brain]
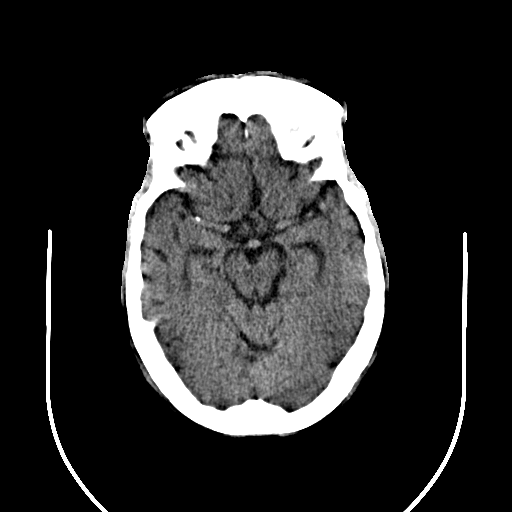
[im 12/36  bone]
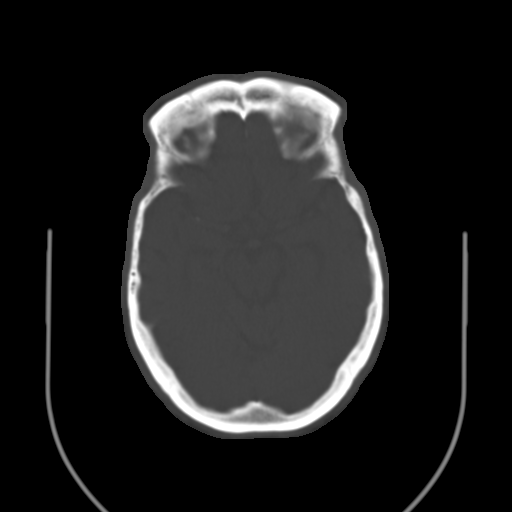
[im 13/36  brain]
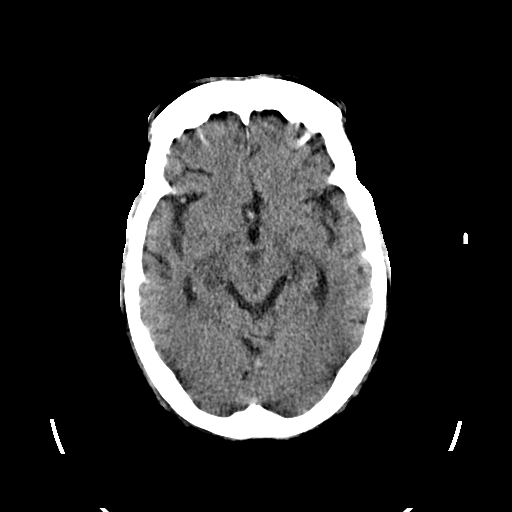
[im 15/36  brain]
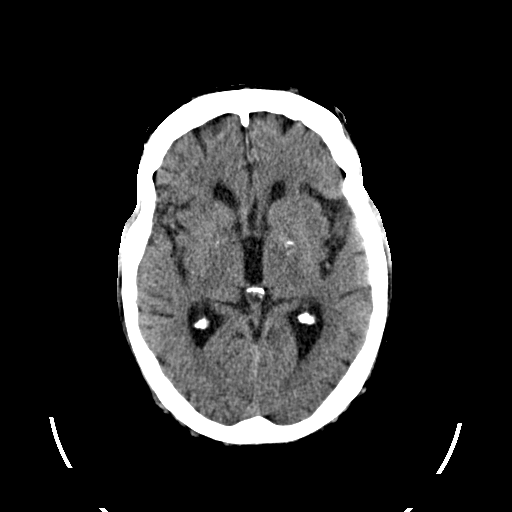
[im 17/36  brain]
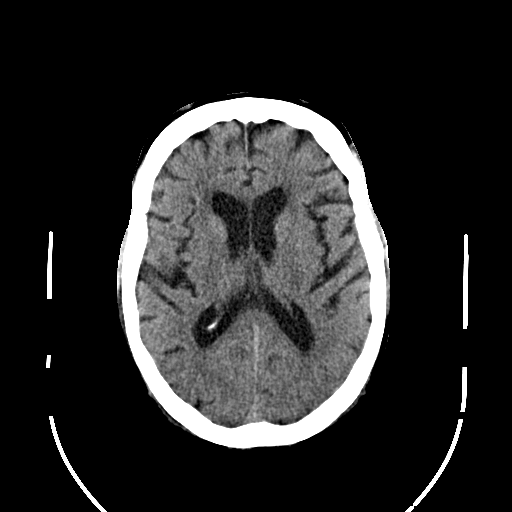
[im 19/36  brain]
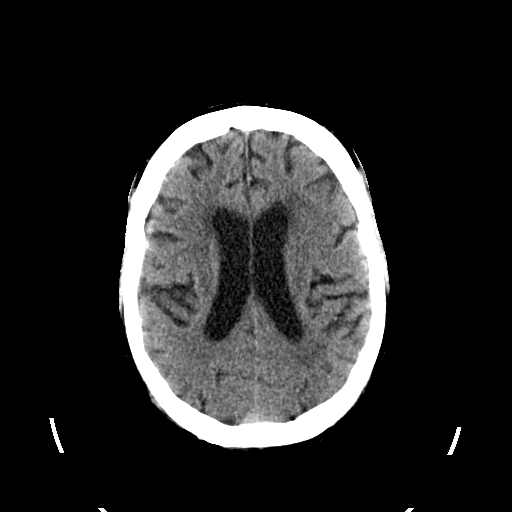
[im 19/36  bone]
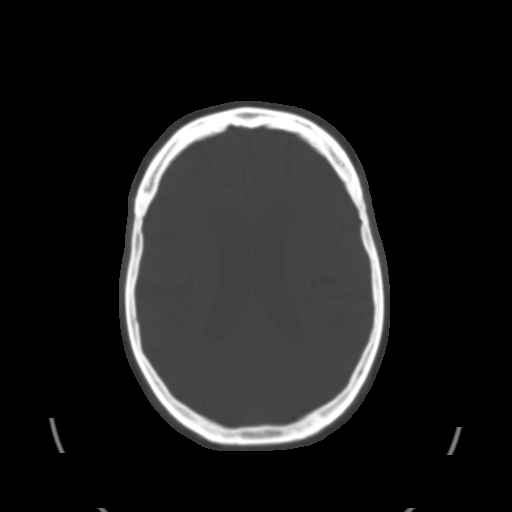
[im 21/36  brain]
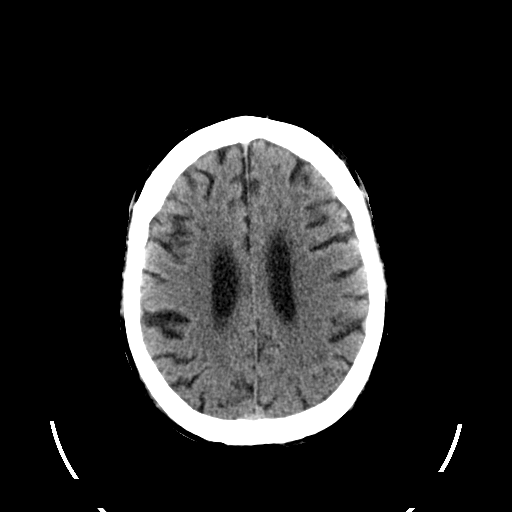
[im 23/36  brain]
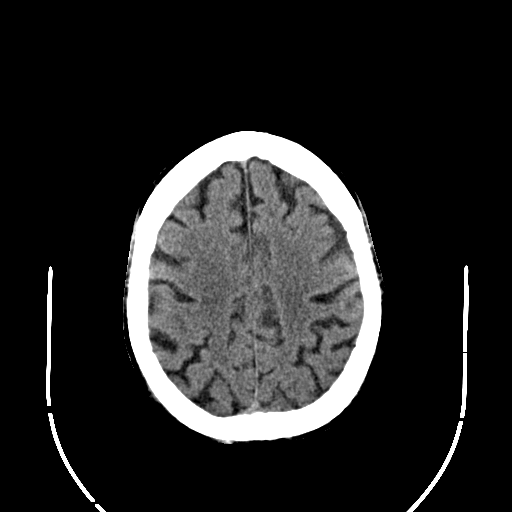
[im 24/36  brain]
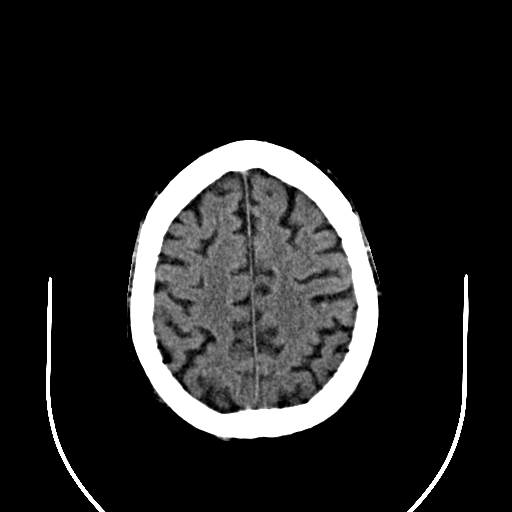
[im 28/36  brain]
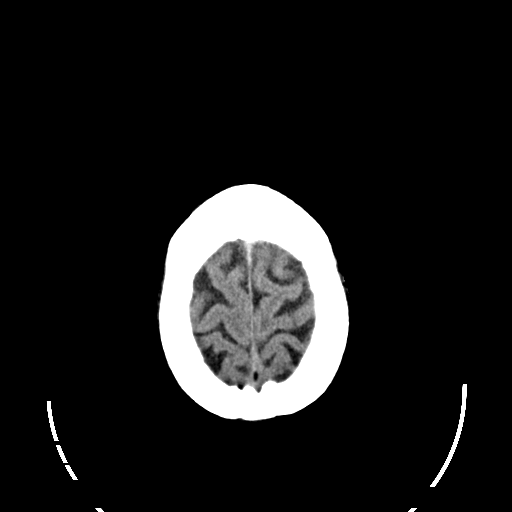
[im 28/36  bone]
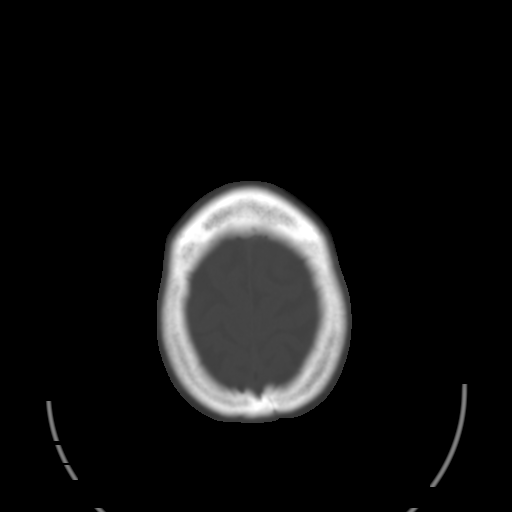
[im 30/36  brain]
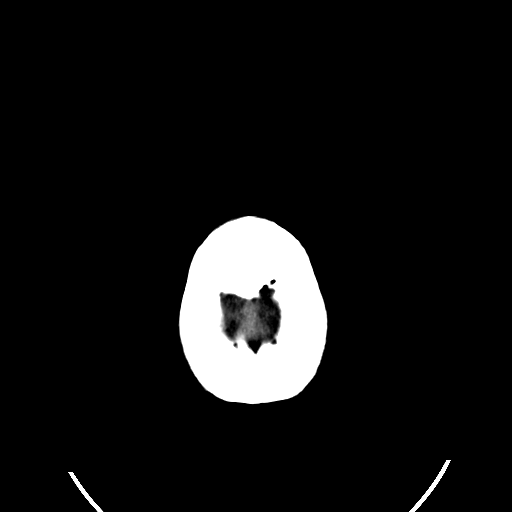
[im 32/36  brain]
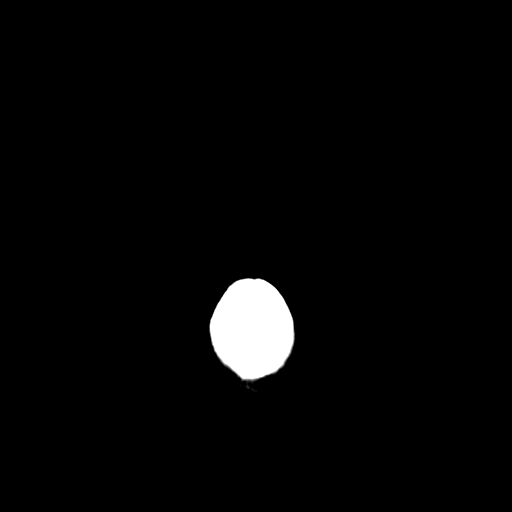
[im 34/36  brain]
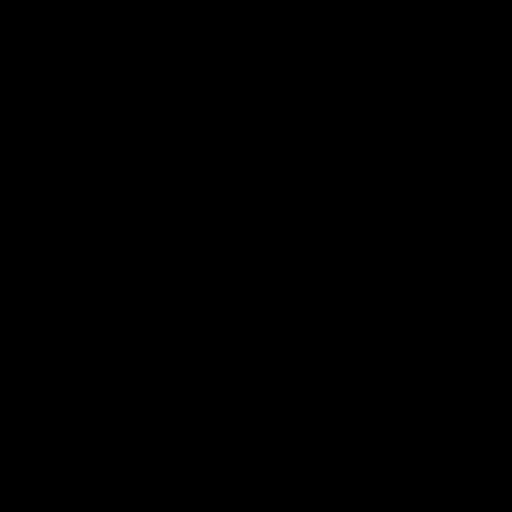

[16 of 30 positions shown; findings below may reference images not displayed]

FINDINGS: Generalized atrophy.
Normal ventricular morphology.
No midline shift or mass effect.
Small vessel chronic ischemic changes of deep cerebral white
matter.
No intracranial hemorrhage, mass lesion or evidence of acute
infarction.
No extra-axial fluid collections.
Bones and sinuses unremarkable.
Extensive atherosclerotic calcifications at skull base.
IMPRESSION: Atrophy with small vessel chronic ischemic changes of deep cerebral
white matter.
No acute intracranial abnormalities.

## 2015-07-10 DIAGNOSIS — E782 Mixed hyperlipidemia: Secondary | ICD-10-CM | POA: Diagnosis not present

## 2015-07-10 DIAGNOSIS — Z0001 Encounter for general adult medical examination with abnormal findings: Secondary | ICD-10-CM | POA: Diagnosis not present

## 2015-07-10 DIAGNOSIS — E139 Other specified diabetes mellitus without complications: Secondary | ICD-10-CM | POA: Diagnosis not present

## 2015-07-10 DIAGNOSIS — Z1389 Encounter for screening for other disorder: Secondary | ICD-10-CM | POA: Diagnosis not present

## 2015-07-10 DIAGNOSIS — R946 Abnormal results of thyroid function studies: Secondary | ICD-10-CM | POA: Diagnosis not present

## 2015-07-10 DIAGNOSIS — R413 Other amnesia: Secondary | ICD-10-CM | POA: Diagnosis not present

## 2015-07-10 DIAGNOSIS — I639 Cerebral infarction, unspecified: Secondary | ICD-10-CM | POA: Diagnosis not present

## 2015-08-19 DIAGNOSIS — R946 Abnormal results of thyroid function studies: Secondary | ICD-10-CM | POA: Diagnosis not present

## 2015-09-09 DIAGNOSIS — R3912 Poor urinary stream: Secondary | ICD-10-CM | POA: Diagnosis not present

## 2015-09-09 DIAGNOSIS — Z0000 Encounter for general adult medical examination without abnormal findings: Secondary | ICD-10-CM | POA: Diagnosis not present

## 2015-09-09 DIAGNOSIS — R3121 Asymptomatic microscopic hematuria: Secondary | ICD-10-CM | POA: Diagnosis not present

## 2015-09-17 ENCOUNTER — Ambulatory Visit: Payer: Medicare Other | Admitting: Sports Medicine

## 2015-09-18 ENCOUNTER — Encounter: Payer: Self-pay | Admitting: Sports Medicine

## 2015-09-18 ENCOUNTER — Ambulatory Visit (INDEPENDENT_AMBULATORY_CARE_PROVIDER_SITE_OTHER): Payer: Medicare Other | Admitting: Sports Medicine

## 2015-09-18 DIAGNOSIS — M79674 Pain in right toe(s): Secondary | ICD-10-CM | POA: Diagnosis not present

## 2015-09-18 DIAGNOSIS — E1142 Type 2 diabetes mellitus with diabetic polyneuropathy: Secondary | ICD-10-CM

## 2015-09-18 DIAGNOSIS — B351 Tinea unguium: Secondary | ICD-10-CM | POA: Diagnosis not present

## 2015-09-18 DIAGNOSIS — E119 Type 2 diabetes mellitus without complications: Secondary | ICD-10-CM

## 2015-09-18 DIAGNOSIS — M79675 Pain in left toe(s): Secondary | ICD-10-CM | POA: Diagnosis not present

## 2015-09-18 NOTE — Progress Notes (Signed)
Patient ID: [name] [name], male   DOB: Oct 30, 1922, 82 y.o.   MRN: [id number] Subjective: [name] [name] is a 82 y.o. male patient with history of type 2 diabetes who presents to office today complaining of long, painful nails  while ambulating in shoes; unable to trim. Patient states that the glucose reading this morning was "good"; assisted by wife but they can not recall FBS #. Patient denies any new changes in medication or new problems. Patient denies any new cramping, numbness, burning or tingling in the legs.  Patient Active Problem List   Diagnosis Date Noted  . Aphasia 12/18/2012  . COUGH 09/04/2009  . PULMONARY FIBROSIS ILD POST INFLAMMATORY CHRONIC 07/19/2007  . DM 06/21/2007   Current Outpatient Prescriptions on File Prior to Visit  Medication Sig Dispense Refill  . clopidogrel (PLAVIX) 75 MG tablet Take 1 tablet (75 mg total) by mouth daily with breakfast.    . meloxicam (MOBIC) 7.5 MG tablet Take 7.5 mg by mouth daily.    . pioglitazone (ACTOS) 45 MG tablet Take 45 mg by mouth daily.    . rosuvastatin (CRESTOR) 10 MG tablet Take 10 mg by mouth daily.     No current facility-administered medications on file prior to visit.   Allergies  Allergen Reactions  . Celecoxib Other (See Comments)    Unknown reaction  . Codeine    Objective: General: Patient is awake, alert, and oriented x 3 and in no acute distress.  Integument: Skin is warm, dry and supple bilateral. Nails are tender, long, thickened and  dystrophic with subungual debris, consistent with onychomycosis, 1-5 bilateral. No signs of infection. No open lesions or preulcerative lesions present bilateral. Remaining integument unremarkable.  Vasculature:  Dorsalis Pedis pulse 1/4 bilateral. Posterior Tibial pulse  1/4 bilateral.  Capillary fill time <3 sec 1-5 bilateral. Scant hair growth to the level of the digits. Temperature gradient within normal limits. No varicosities present bilateral. No edema present  bilateral.   Neurology: The patient has diminished sensation measured with a 5.07/10g Semmes Weinstein Monofilament at all pedal sites bilateral . Vibratory sensation diminished bilateral with tuning fork. No Babinski sign present bilateral.   Musculoskeletal: Hammertoe pedal deformities noted bilateral. Muscular strength 5/5 in all lower extremity muscular groups bilateral without pain or limitation on range of motion . No tenderness with calf compression bilateral.  Assessment and Plan: Problem List Items Addressed This Visit    None    Visit Diagnoses    Dermatophytosis of nail    -  Primary    Pain in toes of both feet        Diabetic polyneuropathy associated with type 2 diabetes mellitus ([place])          -Examined patient. -Discussed and educated patient on diabetic foot care, especially with  regards to the vascular, neurological and musculoskeletal systems.  -Stressed the importance of good glycemic control and the detriment of not  controlling glucose levels in relation to the foot. -Mechanically debrided all nails 1-5 bilateral using sterile nail nipper and filed with dremel without incident  -Answered all patient questions -Patient to return in 3 months for at risk foot care -Patient advised to call the office if any problems or questions arise in the meantime.  Landis Martins, DPM

## 2015-09-18 NOTE — Patient Instructions (Signed)
Diabetes and Foot Care Diabetes may cause you to have problems because of poor blood supply (circulation) to your feet and legs. This may cause the skin on your feet to become thinner, break easier, and heal more slowly. Your skin may become dry, and the skin may peel and crack. You may also have nerve damage in your legs and feet causing decreased feeling in them. You may not notice minor injuries to your feet that could lead to infections or more serious problems. Taking care of your feet is one of the most important things you can do for yourself.  HOME CARE INSTRUCTIONS  Wear shoes at all times, even in the house. Do not go barefoot. Bare feet are easily injured.  Check your feet daily for blisters, cuts, and redness. If you cannot see the bottom of your feet, use a mirror or ask someone for help.  Wash your feet with warm water (do not use hot water) and mild soap. Then pat your feet and the areas between your toes until they are completely dry. Do not soak your feet as this can dry your skin.  Apply a moisturizing lotion or petroleum jelly (that does not contain alcohol and is unscented) to the skin on your feet and to dry, brittle toenails. Do not apply lotion between your toes.  Trim your toenails straight across. Do not dig under them or around the cuticle. File the edges of your nails with an emery board or nail file.  Do not cut corns or calluses or try to remove them with medicine.  Wear clean socks or stockings every day. Make sure they are not too tight. Do not wear knee-high stockings since they may decrease blood flow to your legs.  Wear shoes that fit properly and have enough cushioning. To break in new shoes, wear them for just a few hours a day. This prevents you from injuring your feet. Always look in your shoes before you put them on to be sure there are no objects inside.  Do not cross your legs. This may decrease the blood flow to your feet.  If you find a minor scrape,  cut, or break in the skin on your feet, keep it and the skin around it clean and dry. These areas may be cleansed with mild soap and water. Do not cleanse the area with peroxide, alcohol, or iodine.  When you remove an adhesive bandage, be sure not to damage the skin around it.  If you have a wound, look at it several times a day to make sure it is healing.  Do not use heating pads or hot water bottles. They may burn your skin. If you have lost feeling in your feet or legs, you may not know it is happening until it is too late.  Make sure your health care provider performs a complete foot exam at least annually or more often if you have foot problems. Report any cuts, sores, or bruises to your health care provider immediately. SEEK MEDICAL CARE IF:   You have an injury that is not healing.  You have cuts or breaks in the skin.  You have an ingrown nail.  You notice redness on your legs or feet.  You feel burning or tingling in your legs or feet.  You have pain or cramps in your legs and feet.  Your legs or feet are numb.  Your feet always feel cold. SEEK IMMEDIATE MEDICAL CARE IF:   There is increasing redness,   swelling, or pain in or around a wound.  There is a red line that goes up your leg.  Pus is coming from a wound.  You develop a fever or as directed by your health care provider.  You notice a bad smell coming from an ulcer or wound.   This information is not intended to replace advice given to you by your health care provider. Make sure you discuss any questions you have with your health care provider.   Document Released: 07/16/2000 Document Revised: 03/21/2013 Document Reviewed: 12/26/2012 Elsevier Interactive Patient Education 2016 [employer]  

## 2015-09-30 ENCOUNTER — Ambulatory Visit: Payer: Medicare Other | Admitting: Podiatry

## 2015-10-23 DIAGNOSIS — R946 Abnormal results of thyroid function studies: Secondary | ICD-10-CM | POA: Diagnosis not present

## 2015-12-18 ENCOUNTER — Ambulatory Visit: Payer: Medicare Other | Admitting: Sports Medicine

## 2015-12-31 DIAGNOSIS — E039 Hypothyroidism, unspecified: Secondary | ICD-10-CM | POA: Diagnosis not present

## 2016-01-08 DIAGNOSIS — E039 Hypothyroidism, unspecified: Secondary | ICD-10-CM | POA: Diagnosis not present

## 2016-01-08 DIAGNOSIS — R413 Other amnesia: Secondary | ICD-10-CM | POA: Diagnosis not present

## 2016-01-08 DIAGNOSIS — E139 Other specified diabetes mellitus without complications: Secondary | ICD-10-CM | POA: Diagnosis not present

## 2016-01-08 DIAGNOSIS — I639 Cerebral infarction, unspecified: Secondary | ICD-10-CM | POA: Diagnosis not present

## 2016-01-08 DIAGNOSIS — E7800 Pure hypercholesterolemia, unspecified: Secondary | ICD-10-CM | POA: Diagnosis not present

## 2016-03-23 DIAGNOSIS — M109 Gout, unspecified: Secondary | ICD-10-CM | POA: Diagnosis not present

## 2016-03-23 DIAGNOSIS — M79642 Pain in left hand: Secondary | ICD-10-CM | POA: Diagnosis not present

## 2016-04-07 ENCOUNTER — Ambulatory Visit (INDEPENDENT_AMBULATORY_CARE_PROVIDER_SITE_OTHER): Payer: Medicare Other | Admitting: Sports Medicine

## 2016-04-07 ENCOUNTER — Encounter (INDEPENDENT_AMBULATORY_CARE_PROVIDER_SITE_OTHER): Payer: Self-pay

## 2016-04-07 ENCOUNTER — Encounter: Payer: Self-pay | Admitting: Sports Medicine

## 2016-04-07 DIAGNOSIS — E1142 Type 2 diabetes mellitus with diabetic polyneuropathy: Secondary | ICD-10-CM | POA: Diagnosis not present

## 2016-04-07 DIAGNOSIS — M79674 Pain in right toe(s): Secondary | ICD-10-CM

## 2016-04-07 DIAGNOSIS — B351 Tinea unguium: Secondary | ICD-10-CM | POA: Diagnosis not present

## 2016-04-07 DIAGNOSIS — M79675 Pain in left toe(s): Secondary | ICD-10-CM

## 2016-04-07 NOTE — Progress Notes (Signed)
Patient ID: [name] [name], male   DOB: 27-Aug-1922, 82 y.o.   MRN: [id number] Subjective: [name] [name] is a 82 y.o. male patient with history of type 2 diabetes who presents to office today complaining of long, painful nails  while ambulating in shoes; unable to trim. Patient states that the glucose reading this morning was "good"; assisted by son but they can not recall FBS #. Patient denies any new changes in medication or new problems. Admits flare up of gout in left hand and had to get a shot in back since last visit. Patient denies any new cramping, numbness, burning or tingling in the legs.  Patient Active Problem List   Diagnosis Date Noted  . Aphasia 12/18/2012  . COUGH 09/04/2009  . PULMONARY FIBROSIS ILD POST INFLAMMATORY CHRONIC 07/19/2007  . DM 06/21/2007   Current Outpatient Prescriptions on File Prior to Visit  Medication Sig Dispense Refill  . clopidogrel (PLAVIX) 75 MG tablet Take 1 tablet (75 mg total) by mouth daily with breakfast.    . meloxicam (MOBIC) 7.5 MG tablet Take 7.5 mg by mouth daily.    . pioglitazone (ACTOS) 45 MG tablet Take 45 mg by mouth daily.    . rosuvastatin (CRESTOR) 10 MG tablet Take 10 mg by mouth daily.     No current facility-administered medications on file prior to visit.    Allergies  Allergen Reactions  . Celecoxib Other (See Comments)    Unknown reaction  . Codeine    Objective: General: Patient is awake, alert, and oriented x 3 and in no acute distress.  Integument: Skin is warm, dry and supple bilateral. Nails are tender, long, thickened and dystrophic with subungual debris, consistent with onychomycosis, 1-5 bilateral, Right>Left. No signs of infection. No open lesions or preulcerative lesions present bilateral. Remaining integument unremarkable.  Vasculature:  Dorsalis Pedis pulse 1/4 bilateral. Posterior Tibial pulse  1/4 bilateral. Capillary fill time <3 sec 1-5 bilateral. Scant hair growth to the level of the digits.Temperature  gradient within normal limits. No varicosities present bilateral. No edema present bilateral.   Neurology: The patient has diminished sensation measured with a 5.07/10g Semmes Weinstein Monofilament at all pedal sites bilateral . Vibratory sensation diminished bilateral with tuning fork. No Babinski sign present bilateral.   Musculoskeletal: Hammertoe pedal deformities noted bilateral. Muscular strength 5/5 in all lower extremity muscular groups bilateral without pain or limitation on range of motion . No tenderness with calf compression bilateral.  Assessment and Plan: Problem List Items Addressed This Visit    None    Visit Diagnoses    Dermatophytosis of nail    -  Primary   Pain in toes of both feet       Diabetic polyneuropathy associated with type 2 diabetes mellitus ([place])         -Examined patient. -Discussed and educated patient on diabetic foot care, especially with  regards to the vascular, neurological and musculoskeletal systems.  -Stressed the importance of good glycemic control and the detriment of not controlling glucose levels in relation to the foot. -Mechanically debrided all nails 1-5 bilateral using sterile nail nipper and filed with dremel without incident  -Vinegar soaks ok with assistance of son  -Answered all patient questions -Patient to return in 3 months for at risk foot care -Patient advised to call the office if any problems or questions arise in the meantime.  Landis Martins, DPM

## 2016-07-07 ENCOUNTER — Ambulatory Visit (INDEPENDENT_AMBULATORY_CARE_PROVIDER_SITE_OTHER): Payer: Medicare Other | Admitting: Sports Medicine

## 2016-07-07 ENCOUNTER — Encounter: Payer: Self-pay | Admitting: Sports Medicine

## 2016-07-07 DIAGNOSIS — B351 Tinea unguium: Secondary | ICD-10-CM | POA: Diagnosis not present

## 2016-07-07 DIAGNOSIS — M79674 Pain in right toe(s): Secondary | ICD-10-CM | POA: Diagnosis not present

## 2016-07-07 DIAGNOSIS — E1142 Type 2 diabetes mellitus with diabetic polyneuropathy: Secondary | ICD-10-CM | POA: Diagnosis not present

## 2016-07-07 DIAGNOSIS — M79675 Pain in left toe(s): Secondary | ICD-10-CM

## 2016-07-07 NOTE — Progress Notes (Signed)
Patient ID: [name] [name], male   DOB: 04/11/1923, 82 y.o.   MRN: [id number] Subjective: [name] [name] is a 82 y.o. male patient with history of type 2 diabetes who presents to office today complaining of long, painful nails  while ambulating in shoes; unable to trim. Patient states that the glucose reading this morning was "good"; assisted by son but they can not recall FBS #. Patient denies any new changes in medication or new problems. Patient denies any new cramping, numbness, burning or tingling in the legs.  Patient Active Problem List   Diagnosis Date Noted  . Aphasia 12/18/2012  . COUGH 09/04/2009  . PULMONARY FIBROSIS ILD POST INFLAMMATORY CHRONIC 07/19/2007  . DM 06/21/2007   Current Outpatient Prescriptions on File Prior to Visit  Medication Sig Dispense Refill  . clopidogrel (PLAVIX) 75 MG tablet Take 1 tablet (75 mg total) by mouth daily with breakfast.    . meloxicam (MOBIC) 7.5 MG tablet Take 7.5 mg by mouth daily.    . pioglitazone (ACTOS) 45 MG tablet Take 45 mg by mouth daily.    . rosuvastatin (CRESTOR) 10 MG tablet Take 10 mg by mouth daily.     No current facility-administered medications on file prior to visit.    Allergies  Allergen Reactions  . Celecoxib Other (See Comments)    Unknown reaction  . Codeine    Objective: General: Patient is awake, alert, and oriented x 3 and in no acute distress.  Integument: Skin is warm, dry and supple bilateral. Nails are tender, long, thickened and dystrophic with subungual debris, consistent with onychomycosis, 1-5 bilateral, Right>Left. No signs of infection. No open lesions or preulcerative lesions present bilateral. Remaining integument unremarkable.  Vasculature:  Dorsalis Pedis pulse 1/4 bilateral. Posterior Tibial pulse  1/4 bilateral. Capillary fill time <3 sec 1-5 bilateral. Scant hair growth to the level of the digits.Temperature gradient within normal limits. No varicosities present bilateral. No edema present  bilateral.   Neurology: The patient has diminished sensation measured with a 5.07/10g Semmes Weinstein Monofilament at all pedal sites bilateral . Vibratory sensation diminished bilateral with tuning fork. No Babinski sign present bilateral.   Musculoskeletal: Hammertoe pedal deformities noted bilateral. Muscular strength 5/5 in all lower extremity muscular groups bilateral without pain or limitation on range of motion . No tenderness with calf compression bilateral.  Assessment and Plan: Problem List Items Addressed This Visit    None    Visit Diagnoses    Dermatophytosis of nail    -  Primary   Pain in toes of both feet       Diabetic polyneuropathy associated with type 2 diabetes mellitus ([place])         -Examined patient. -Discussed and educated patient on diabetic foot care, especially with  regards to the vascular, neurological and musculoskeletal systems.  -Stressed the importance of good glycemic control and the detriment of not controlling glucose levels in relation to the foot. -Mechanically debrided all nails 1-5 bilateral using sterile nail nipper and filed with dremel without incident  -Continue with Vinegar soaks ok with assistance of son  -Answered all patient questions -Patient to return in 3 months for at risk foot care -Patient advised to call the office if any problems or questions arise in the meantime.  Landis Martins, DPM

## 2016-07-15 DIAGNOSIS — Z0000 Encounter for general adult medical examination without abnormal findings: Secondary | ICD-10-CM | POA: Diagnosis not present

## 2016-07-15 DIAGNOSIS — Z1389 Encounter for screening for other disorder: Secondary | ICD-10-CM | POA: Diagnosis not present

## 2016-08-30 DIAGNOSIS — L989 Disorder of the skin and subcutaneous tissue, unspecified: Secondary | ICD-10-CM | POA: Diagnosis not present

## 2016-10-06 ENCOUNTER — Ambulatory Visit (INDEPENDENT_AMBULATORY_CARE_PROVIDER_SITE_OTHER): Payer: Medicare Other | Admitting: Sports Medicine

## 2016-10-06 ENCOUNTER — Ambulatory Visit: Payer: Medicare Other | Admitting: Sports Medicine

## 2016-10-06 ENCOUNTER — Encounter: Payer: Self-pay | Admitting: Sports Medicine

## 2016-10-06 DIAGNOSIS — M79675 Pain in left toe(s): Secondary | ICD-10-CM | POA: Diagnosis not present

## 2016-10-06 DIAGNOSIS — E1142 Type 2 diabetes mellitus with diabetic polyneuropathy: Secondary | ICD-10-CM | POA: Diagnosis not present

## 2016-10-06 DIAGNOSIS — M79674 Pain in right toe(s): Secondary | ICD-10-CM | POA: Diagnosis not present

## 2016-10-06 DIAGNOSIS — B351 Tinea unguium: Secondary | ICD-10-CM

## 2016-10-06 NOTE — Progress Notes (Signed)
Patient ID: FAHD GALEA, male   DOB: [birth year], 83 y.o.   MRN: [id number] Subjective: [name] [name] is a 83 y.o. male patient with history of type 2 diabetes who presents to office today complaining of long, painful nails  while ambulating in shoes; unable to trim. Patient states that the glucose reading this morning was not recorded. Patient is assisted with wife. Patient denies any new changes in medication or new problems. Patient denies any new cramping, numbness, burning or tingling in the legs.  Patient Active Problem List   Diagnosis Date Noted  . Aphasia 12/18/2012  . COUGH 09/04/2009  . PULMONARY FIBROSIS ILD POST INFLAMMATORY CHRONIC 07/19/2007  . DM 06/21/2007   Current Outpatient Prescriptions on File Prior to Visit  Medication Sig Dispense Refill  . clopidogrel (PLAVIX) 75 MG tablet Take 1 tablet (75 mg total) by mouth daily with breakfast.    . meloxicam (MOBIC) 7.5 MG tablet Take 7.5 mg by mouth daily.    . pioglitazone (ACTOS) 45 MG tablet Take 45 mg by mouth daily.    . rosuvastatin (CRESTOR) 10 MG tablet Take 10 mg by mouth daily.     No current facility-administered medications on file prior to visit.    Allergies  Allergen Reactions  . Celecoxib Other (See Comments)    Unknown reaction  . Codeine    Objective: General: Patient is awake, alert, and oriented x 3 and in no acute distress.  Integument: Skin is warm, dry and supple bilateral. Nails are tender, long, thickened and dystrophic with subungual debris, consistent with onychomycosis, 1-5 bilateral, Right>Left. No signs of infection. No open lesions or preulcerative lesions present bilateral. Remaining integument unremarkable.  Vasculature:  Dorsalis Pedis pulse 1/4 bilateral. Posterior Tibial pulse  1/4 bilateral. Capillary fill time <3 sec 1-5 bilateral. Scant hair growth to the level of the digits.Temperature gradient within normal limits. No varicosities present bilateral. No edema present bilateral.    Neurology: The patient has diminished sensation measured with a 5.07/10g Semmes Weinstein Monofilament at all pedal sites bilateral . Vibratory sensation diminished bilateral with tuning fork. No Babinski sign present bilateral.   Musculoskeletal: Hammertoe pedal deformities noted bilateral. Muscular strength 5/5 in all lower extremity muscular groups bilateral without pain or limitation on range of motion . No tenderness with calf compression bilateral.  Assessment and Plan: Problem List Items Addressed This Visit    None    Visit Diagnoses    Dermatophytosis of nail    -  Primary   Pain in toes of both feet       Diabetic polyneuropathy associated with type 2 diabetes mellitus ([place])         -Examined patient. -Discussed and educated patient on diabetic foot care, especially with  regards to the vascular, neurological and musculoskeletal systems.  -Stressed the importance of good glycemic control and the detriment of not controlling glucose levels in relation to the foot. -Mechanically debrided all nails 1-5 bilateral using sterile nail nipper and filed with dremel without incident  -Answered all patient questions -Patient to return in 3 months for at risk foot care -Patient advised to call the office if any problems or questions arise in the meantime.  Landis Martins, DPM

## 2017-01-12 ENCOUNTER — Ambulatory Visit: Payer: Medicare Other | Admitting: Sports Medicine

## 2017-01-28 ENCOUNTER — Ambulatory Visit (INDEPENDENT_AMBULATORY_CARE_PROVIDER_SITE_OTHER): Payer: Medicare Other | Admitting: Sports Medicine

## 2017-01-28 DIAGNOSIS — B351 Tinea unguium: Secondary | ICD-10-CM | POA: Diagnosis not present

## 2017-01-28 DIAGNOSIS — M79674 Pain in right toe(s): Secondary | ICD-10-CM | POA: Diagnosis not present

## 2017-01-28 DIAGNOSIS — M79675 Pain in left toe(s): Secondary | ICD-10-CM

## 2017-01-28 DIAGNOSIS — E1142 Type 2 diabetes mellitus with diabetic polyneuropathy: Secondary | ICD-10-CM

## 2017-01-28 NOTE — Progress Notes (Signed)
Patient ID: [name] [name], male   DOB: 12-Sep-1922, 83 y.o.   MRN: [id number] Subjective: [name] [name] is a 83 y.o. male patient with history of type 2 diabetes who presents to office today complaining of long, painful nails  while ambulating in shoes; unable to trim. Patient states that the glucose reading this morning was not recorded. Patient is assisted with wife, who is in the waiting room. Patient denies any new changes in medication or new problems.  Patient Active Problem List   Diagnosis Date Noted  . Aphasia 12/18/2012  . COUGH 09/04/2009  . PULMONARY FIBROSIS ILD POST INFLAMMATORY CHRONIC 07/19/2007  . DM 06/21/2007   Current Outpatient Prescriptions on File Prior to Visit  Medication Sig Dispense Refill  . clopidogrel (PLAVIX) 75 MG tablet Take 1 tablet (75 mg total) by mouth daily with breakfast.    . meloxicam (MOBIC) 7.5 MG tablet Take 7.5 mg by mouth daily.    . pioglitazone (ACTOS) 45 MG tablet Take 45 mg by mouth daily.    . rosuvastatin (CRESTOR) 10 MG tablet Take 10 mg by mouth daily.     No current facility-administered medications on file prior to visit.    Allergies  Allergen Reactions  . Celecoxib Other (See Comments)    Unknown reaction  . Codeine    Objective: General: Patient is awake, alert, and oriented x 3 and in no acute distress.  Integument: Skin is warm, dry and supple bilateral. Nails are tender, long, thickened and dystrophic with subungual debris, consistent with onychomycosis, 1-5 bilateral, Right>Left. No signs of infection. No open lesions or preulcerative lesions present bilateral. Remaining integument unremarkable.  Vasculature:  Dorsalis Pedis pulse 1/4 bilateral. Posterior Tibial pulse  1/4 bilateral. Capillary fill time <3 sec 1-5 bilateral. Scant hair growth to the level of the digits.Temperature gradient within normal limits. No varicosities present bilateral. No edema present bilateral.   Neurology: The patient has diminished  sensation measured with a 5.07/10g Semmes Weinstein Monofilament at all pedal sites bilateral . Vibratory sensation diminished bilateral with tuning fork. No Babinski sign present bilateral.   Musculoskeletal: Hammertoe pedal deformities noted bilateral. Muscular strength 5/5 in all lower extremity muscular groups bilateral without pain or limitation on range of motion . No tenderness with calf compression bilateral.  Assessment and Plan: Problem List Items Addressed This Visit    None    Visit Diagnoses    Dermatophytosis of nail    -  Primary   Pain in toes of both feet       Diabetic polyneuropathy associated with type 2 diabetes mellitus ([place])         -Examined patient. -Discussed and educated patient on diabetic foot care, especially with  regards to the vascular, neurological and musculoskeletal systems.  -Stressed the importance of good glycemic control and the detriment of not controlling glucose levels in relation to the foot. -Mechanically debrided all nails 1-5 bilateral using sterile nail nipper and filed with dremel without incident  -Answered all patient questions -Patient to return in 3 months for at risk foot care -Patient advised to call the office if any problems or questions arise in the meantime.  Landis Martins, DPM

## 2017-03-20 ENCOUNTER — Encounter (HOSPITAL_COMMUNITY): Payer: Self-pay

## 2017-03-20 ENCOUNTER — Emergency Department (HOSPITAL_COMMUNITY)
Admission: EM | Admit: 2017-03-20 | Discharge: 2017-03-21 | Disposition: A | Discharge status: Home / Self Care | Payer: Medicare Other | Attending: Emergency Medicine | Admitting: Emergency Medicine

## 2017-03-20 DIAGNOSIS — Y92009 Unspecified place in unspecified non-institutional (private) residence as the place of occurrence of the external cause: Secondary | ICD-10-CM | POA: Diagnosis not present

## 2017-03-20 DIAGNOSIS — T148XXA Other injury of unspecified body region, initial encounter: Secondary | ICD-10-CM | POA: Diagnosis not present

## 2017-03-20 DIAGNOSIS — K5900 Constipation, unspecified: Secondary | ICD-10-CM

## 2017-03-20 DIAGNOSIS — S32010S Wedge compression fracture of first lumbar vertebra, sequela: Secondary | ICD-10-CM | POA: Diagnosis not present

## 2017-03-20 DIAGNOSIS — S3993XA Unspecified injury of pelvis, initial encounter: Secondary | ICD-10-CM | POA: Diagnosis not present

## 2017-03-20 DIAGNOSIS — W19XXXA Unspecified fall, initial encounter: Secondary | ICD-10-CM | POA: Diagnosis not present

## 2017-03-20 DIAGNOSIS — S199XXA Unspecified injury of neck, initial encounter: Secondary | ICD-10-CM | POA: Diagnosis not present

## 2017-03-20 DIAGNOSIS — Z79899 Other long term (current) drug therapy: Secondary | ICD-10-CM | POA: Insufficient documentation

## 2017-03-20 DIAGNOSIS — Z7902 Long term (current) use of antithrombotics/antiplatelets: Secondary | ICD-10-CM | POA: Diagnosis not present

## 2017-03-20 DIAGNOSIS — Y999 Unspecified external cause status: Secondary | ICD-10-CM | POA: Insufficient documentation

## 2017-03-20 DIAGNOSIS — M5489 Other dorsalgia: Secondary | ICD-10-CM | POA: Diagnosis not present

## 2017-03-20 DIAGNOSIS — S32000S Wedge compression fracture of unspecified lumbar vertebra, sequela: Secondary | ICD-10-CM | POA: Insufficient documentation

## 2017-03-20 DIAGNOSIS — F0390 Unspecified dementia without behavioral disturbance: Secondary | ICD-10-CM | POA: Insufficient documentation

## 2017-03-20 DIAGNOSIS — Z043 Encounter for examination and observation following other accident: Secondary | ICD-10-CM | POA: Diagnosis present

## 2017-03-20 DIAGNOSIS — E119 Type 2 diabetes mellitus without complications: Secondary | ICD-10-CM | POA: Insufficient documentation

## 2017-03-20 DIAGNOSIS — S0990XA Unspecified injury of head, initial encounter: Secondary | ICD-10-CM | POA: Diagnosis not present

## 2017-03-20 DIAGNOSIS — Y939 Activity, unspecified: Secondary | ICD-10-CM | POA: Diagnosis not present

## 2017-03-20 DIAGNOSIS — I10 Essential (primary) hypertension: Secondary | ICD-10-CM | POA: Diagnosis not present

## 2017-03-20 DIAGNOSIS — S3991XA Unspecified injury of abdomen, initial encounter: Secondary | ICD-10-CM | POA: Diagnosis not present

## 2017-03-20 HISTORY — DX: Wedge compression fracture of unspecified lumbar vertebra, initial encounter for closed fracture: S32.000A

## 2017-03-20 LAB — I-STAT CHEM 8, ED
BUN: 14 mg/dL (ref 6–20)
CALCIUM ION: 1.15 mmol/L (ref 1.15–1.40)
Chloride: 99 mmol/L — ABNORMAL LOW (ref 101–111)
Creatinine, Ser: 0.8 mg/dL (ref 0.61–1.24)
Glucose, Bld: 152 mg/dL — ABNORMAL HIGH (ref 65–99)
HEMATOCRIT: 44 % (ref 39.0–52.0)
Hemoglobin: 15 g/dL (ref 13.0–17.0)
POTASSIUM: 3.8 mmol/L (ref 3.5–5.1)
SODIUM: 139 mmol/L (ref 135–145)
TCO2: 30 mmol/L (ref 0–100)

## 2017-03-20 LAB — CBC WITH DIFFERENTIAL/PLATELET
BASOS ABS: 0 10*3/uL (ref 0.0–0.1)
BASOS PCT: 0 %
EOS ABS: 0.1 10*3/uL (ref 0.0–0.7)
EOS PCT: 1 %
HCT: 43.8 % (ref 39.0–52.0)
HEMOGLOBIN: 14.3 g/dL (ref 13.0–17.0)
LYMPHS ABS: 1.7 10*3/uL (ref 0.7–4.0)
Lymphocytes Relative: 24 %
MCH: 28.7 pg (ref 26.0–34.0)
MCHC: 32.6 g/dL (ref 30.0–36.0)
MCV: 88 fL (ref 78.0–100.0)
Monocytes Absolute: 0.6 10*3/uL (ref 0.1–1.0)
Monocytes Relative: 8 %
NEUTROS PCT: 67 %
Neutro Abs: 4.9 10*3/uL (ref 1.7–7.7)
PLATELETS: 178 10*3/uL (ref 150–400)
RBC: 4.98 MIL/uL (ref 4.22–5.81)
RDW: 13.1 % (ref 11.5–15.5)
WBC: 7.3 10*3/uL (ref 4.0–10.5)

## 2017-03-20 NOTE — ED Provider Notes (Signed)
[facility] DEPT Provider Note   CSN: [id number] Arrival date & time: 03/20/17  2256     History   Chief Complaint Chief Complaint  Patient presents with  . Fall    HPI [name] [name] is a 83 y.o. male.  The history is provided by the EMS personnel. The history is limited by the condition of the patient (dementia).  Fall  This is a new problem. The problem occurs constantly. The problem has not changed since onset.Pertinent negatives include no chest pain and no shortness of breath. Nothing aggravates the symptoms. Nothing relieves the symptoms. He has tried nothing for the symptoms. The treatment provided no relief.    Past Medical History:  Diagnosis Date  . Arthritis   . Bilateral hearing loss   . Bladder incontinence   . BPH (benign prostatic hyperplasia)   . Cataract   . Cyst of right kidney   . Dementia    wife states mild dementia  . Diabetes mellitus without complication ([place])   . Essential hypertension, benign   . H/O: CVA (cerebrovascular accident) 2014   left occipital   . Hypercholesteremia   . Memory difficulties   . OA (osteoarthritis)     Patient Active Problem List   Diagnosis Date Noted  . Aphasia 12/18/2012  . COUGH 09/04/2009  . PULMONARY FIBROSIS ILD POST INFLAMMATORY CHRONIC 07/19/2007  . DM 06/21/2007    Past Surgical History:  Procedure Laterality Date  . BACK SURGERY    . CARPAL TUNNEL RELEASE Right 2002  . [place]  2005  . TRANSURETHRAL RESECTION OF PROSTATE  2006       Home Medications    Prior to Admission medications   Medication Sig Start Date End Date Taking? Authorizing Provider  clopidogrel (PLAVIX) 75 MG tablet Take 1 tablet (75 mg total) by mouth daily with breakfast. 12/22/12   Seward Carol, MD  meloxicam (MOBIC) 7.5 MG tablet Take 7.5 mg by mouth daily.    [provider]  pioglitazone (ACTOS) 45 MG tablet Take 45 mg by mouth daily.    [provider]  rosuvastatin (CRESTOR) 10  MG tablet Take 10 mg by mouth daily.    [provider]    Family History Family History  Problem Relation Age of Onset  . Diabetes Unknown   . Cancer Mother     Social History Social History  Substance Use Topics  . Smoking status: Never Smoker  . Smokeless tobacco: Never Used  . Alcohol use No     Allergies   Celecoxib and Codeine   Review of Systems Review of Systems  Unable to perform ROS: Dementia  Respiratory: Negative for shortness of breath.   Cardiovascular: Negative for chest pain.     Physical Exam Updated Vital Signs Ht 5\' 10"  (1.778 m)   Wt 96.2 kg (212 lb)   BMI 30.42 kg/m   Physical Exam  Constitutional: He appears well-developed and well-nourished. No distress.  HENT:  Head: Normocephalic and atraumatic. Head is without raccoon's eyes and without Battle's sign.  Mouth/Throat: No oropharyngeal exudate.  Eyes: Pupils are equal, round, and reactive to light. Conjunctivae are normal.  Neck: Normal range of motion. Neck supple.  Cardiovascular: Normal rate, regular rhythm, normal heart sounds and intact distal pulses.   Pulmonary/Chest: Effort normal and breath sounds normal. He has no wheezes. He has no rales.  Abdominal: Soft. Bowel sounds are normal. He exhibits no mass. There is no tenderness. There is no rebound and  no guarding.  Musculoskeletal: Normal range of motion. He exhibits no edema, tenderness or deformity.  Neurological: He is alert. He displays normal reflexes.  Skin: Skin is warm and dry. Capillary refill takes less than 2 seconds.     ED Treatments / Results  Labs (all labs ordered are listed, but only abnormal results are displayed)  Results for orders placed or performed during the hospital encounter of 03/20/17  CBC with Differential/Platelet  Result Value Ref Range   WBC 7.3 4.0 - 10.5 K/uL   RBC 4.98 4.22 - 5.81 MIL/uL   Hemoglobin 14.3 13.0 - 17.0 g/dL   HCT 43.8 39.0 - 52.0 %   MCV 88.0 78.0 - 100.0 fL    MCH 28.7 26.0 - 34.0 pg   MCHC 32.6 30.0 - 36.0 g/dL   RDW 13.1 11.5 - 15.5 %   Platelets 178 150 - 400 K/uL   Neutrophils Relative % 67 %   Neutro Abs 4.9 1.7 - 7.7 K/uL   Lymphocytes Relative 24 %   Lymphs Abs 1.7 0.7 - 4.0 K/uL   Monocytes Relative 8 %   Monocytes Absolute 0.6 0.1 - 1.0 K/uL   Eosinophils Relative 1 %   Eosinophils Absolute 0.1 0.0 - 0.7 K/uL   Basophils Relative 0 %   Basophils Absolute 0.0 0.0 - 0.1 K/uL  I-Stat Chem 8, ED  Result Value Ref Range   Sodium 139 135 - 145 mmol/L   Potassium 3.8 3.5 - 5.1 mmol/L   Chloride 99 (L) 101 - 111 mmol/L   BUN 14 6 - 20 mg/dL   Creatinine, Ser 0.80 0.61 - 1.24 mg/dL   Glucose, Bld 152 (H) 65 - 99 mg/dL   Calcium, Ion 1.15 1.15 - 1.40 mmol/L   TCO2 30 0 - 100 mmol/L   Hemoglobin 15.0 13.0 - 17.0 g/dL   HCT 44.0 39.0 - 52.0 %   No results found.  Radiology No results found.  Procedures Procedures (including critical care time)     Final Clinical Impressions(s) / ED Diagnoses    The patient is very well appearing and has been observed in the ED.  Strict return precautions given for  intractable Headache, changes in vision or thinking, chest pain, dyspnea on exertion, weakness, vomiting, diarrhea,  Inability to tolerate liquids or food, fevers > 101, rashes on the skin, altered mental status or any concerns. No signs of systemic illness or infection. The patient is nontoxic-appearing on exam and vital signs are within normal limits.  He was here for fall, the L1 fracture is chronic seen in 2012 will refer to orthopedics and the constipation is also chronic we will start patient on a bowel regimen.    I have reviewed the triage vital signs and the nursing notes. Pertinent labs &imaging results that were available during my care of the patient were reviewed by me and considered in my medical decision making (see chart for details).  After history, exam, and medical workup I feel the patient has been appropriately  medically screened and is safe for discharge home. Pertinent diagnoses were discussed with the patient. Patient was given return precautions.    [name], MD 03/21/17 [phone]

## 2017-03-20 NOTE — ED Notes (Signed)
Bed: IX65 Expected date:  Expected time:  Means of arrival:  Comments: 40m fall

## 2017-03-20 NOTE — ED Triage Notes (Signed)
Fall today at home and hit right side on coffee table no LOC no other complaints of pain voiced.

## 2017-03-21 ENCOUNTER — Encounter (HOSPITAL_COMMUNITY): Payer: Self-pay | Admitting: Emergency Medicine

## 2017-03-21 ENCOUNTER — Emergency Department (HOSPITAL_COMMUNITY): Payer: Medicare Other

## 2017-03-21 DIAGNOSIS — S3993XA Unspecified injury of pelvis, initial encounter: Secondary | ICD-10-CM | POA: Diagnosis not present

## 2017-03-21 DIAGNOSIS — S3991XA Unspecified injury of abdomen, initial encounter: Secondary | ICD-10-CM | POA: Diagnosis not present

## 2017-03-21 DIAGNOSIS — S199XXA Unspecified injury of neck, initial encounter: Secondary | ICD-10-CM | POA: Diagnosis not present

## 2017-03-21 DIAGNOSIS — S32000S Wedge compression fracture of unspecified lumbar vertebra, sequela: Secondary | ICD-10-CM | POA: Diagnosis not present

## 2017-03-21 DIAGNOSIS — S0990XA Unspecified injury of head, initial encounter: Secondary | ICD-10-CM | POA: Diagnosis not present

## 2017-03-21 MED ORDER — IOPAMIDOL (ISOVUE-300) INJECTION 61%
100.0000 mL | Freq: Once | INTRAVENOUS | Status: AC | PRN
  Administered 2017-03-21: 100 mL via INTRAVENOUS

## 2017-03-21 MED ORDER — POLYETHYLENE GLYCOL 3350 17 G PO PACK: 17.0000 g | PACK | Freq: Every day | ORAL | 0 refills | Status: AC

## 2017-03-21 MED ORDER — GLYCERIN (ADULT) 2 G RE SUPP: 1.0000 | RECTAL | 0 refills | Status: AC | PRN

## 2017-03-21 MED ORDER — IOPAMIDOL (ISOVUE-300) INJECTION 61%
INTRAVENOUS | Status: AC
  Filled 2017-03-21: qty 100

## 2017-03-21 MED ORDER — DOCUSATE SODIUM 100 MG PO CAPS: 100.0000 mg | ORAL_CAPSULE | Freq: Two times a day (BID) | ORAL | 0 refills | Status: AC

## 2017-04-29 ENCOUNTER — Ambulatory Visit: Payer: Medicare Other | Admitting: Sports Medicine

## 2017-05-24 ENCOUNTER — Ambulatory Visit (INDEPENDENT_AMBULATORY_CARE_PROVIDER_SITE_OTHER): Payer: Medicare Other | Admitting: Podiatry

## 2017-05-24 DIAGNOSIS — D689 Coagulation defect, unspecified: Secondary | ICD-10-CM

## 2017-05-24 DIAGNOSIS — B351 Tinea unguium: Secondary | ICD-10-CM | POA: Diagnosis not present

## 2017-05-24 DIAGNOSIS — E1151 Type 2 diabetes mellitus with diabetic peripheral angiopathy without gangrene: Secondary | ICD-10-CM | POA: Diagnosis not present

## 2017-05-24 NOTE — Progress Notes (Signed)
  Subjective:  Patient ID: [name] [name], male    DOB: 1923-07-26,  MRN: [id number]  Chief Complaint  Patient presents with  . Diabetes    diabetic exam   . Nail Problem    nail care     83 y.o. male returns for the above complaint.  Did not take sugar this a.m.  Reports elongated nails requesting nail care today.  Takes Eliquis for anticoagulation  Objective:  There were no vitals filed for this visit. General AA&O x3. Normal mood and affect.  Vascular Dorsalis pedis and posterior tibial pulses  present 1+ and absent bilaterally  Capillary refill normal to all digits. Pedal hair growth normal.  Neurologic Epicritic sensation grossly present.  Dermatologic No open lesions. Interspaces clear of maceration. Elongated and thickened with brittle crumbly texture, yellow discoloration  Orthopedic: MMT 5/5 in dorsiflexion, plantarflexion, inversion, and eversion. Normal joint ROM without pain or crepitus.   Assessment & Plan:  Patient was evaluated and treated and all questions answered.  Diabetes with PAD, Coagulation Defect, Onychomycosis -Nails x10 debrided sharply and manually with large nail nipper and rotary burr. -Educated on self care. -Follow-up in 3 months for routine foot care.  Procedure: Nail Debridement Rationale: Patient meets criteria for routine foot care due to coagulation defect. Type of Debridement: manual, sharp debridement. Instrumentation: Nail nipper, rotary burr. Number of Nails: 10

## 2017-08-29 ENCOUNTER — Ambulatory Visit (INDEPENDENT_AMBULATORY_CARE_PROVIDER_SITE_OTHER): Payer: Medicare Other | Admitting: Podiatry

## 2017-08-29 DIAGNOSIS — B351 Tinea unguium: Secondary | ICD-10-CM

## 2017-08-29 DIAGNOSIS — D689 Coagulation defect, unspecified: Secondary | ICD-10-CM

## 2017-08-29 DIAGNOSIS — E1151 Type 2 diabetes mellitus with diabetic peripheral angiopathy without gangrene: Secondary | ICD-10-CM

## 2017-08-29 NOTE — Progress Notes (Signed)
  Subjective:  Patient ID: [name] [name], male    DOB: 1923/04/21,  MRN: [id number]  Chief Complaint  Patient presents with  . Diabetes    Diabetic foot exam   . Nail Problem    nail care    84 y.o. male returns for diabetic foot care. Didn't check sugar this AM. On eliquis for anticoagulation. Last saw PCP 1 year ago.  Objective:  There were no vitals filed for this visit. General AA&O x3. Normal mood and affect.  Vascular Dorsalis pedis pulses present 1+ bilaterally  Posterior tibial pulses absent bilaterally  Capillary refill normal to all digits. Pedal hair growth diminished.  Neurologic Epicritic sensation present bilaterally.  Dermatologic No open lesions. Interspaces clear of maceration.  Normal skin temperature and turgor. Hyperkeratotic lesions: none bilaterally. Nails: brittle, discoloration yellow, onychomycosis, thickening  Orthopedic: No history of amputation. MMT 5/5 in dorsiflexion, plantarflexion, inversion, and eversion. Normal lower extremity joint ROM without pain or crepitus.   Assessment & Plan:  Patient was evaluated and treated and all questions answered.  Diabetes with PAD, Coagulation Defect, Onychomycosis -Educated on diabetic footcare. Diabetic risk level 1 -At risk foot care provided as below.  Procedure: Nail Debridement Rationale: Patient meets criteria for routine foot care due to coagulation defect. Type of Debridement: manual, sharp debridement. Instrumentation: Nail nipper, rotary burr. Number of Nails: 10  Return in about 3 months (around 11/27/2017) for Routine Foot Care.

## 2017-09-15 DIAGNOSIS — D0461 Carcinoma in situ of skin of right upper limb, including shoulder: Secondary | ICD-10-CM | POA: Diagnosis not present

## 2017-09-15 DIAGNOSIS — L570 Actinic keratosis: Secondary | ICD-10-CM | POA: Diagnosis not present

## 2017-09-15 DIAGNOSIS — L821 Other seborrheic keratosis: Secondary | ICD-10-CM | POA: Diagnosis not present

## 2017-09-15 DIAGNOSIS — D485 Neoplasm of uncertain behavior of skin: Secondary | ICD-10-CM | POA: Diagnosis not present

## 2017-09-15 DIAGNOSIS — L723 Sebaceous cyst: Secondary | ICD-10-CM | POA: Diagnosis not present

## 2017-09-15 DIAGNOSIS — D1801 Hemangioma of skin and subcutaneous tissue: Secondary | ICD-10-CM | POA: Diagnosis not present

## 2017-09-15 DIAGNOSIS — Z85828 Personal history of other malignant neoplasm of skin: Secondary | ICD-10-CM | POA: Diagnosis not present

## 2017-11-28 ENCOUNTER — Ambulatory Visit: Payer: Medicare Other | Admitting: Podiatry

## 2017-11-28 ENCOUNTER — Ambulatory Visit (INDEPENDENT_AMBULATORY_CARE_PROVIDER_SITE_OTHER): Payer: Medicare Other | Admitting: Podiatry

## 2017-11-28 DIAGNOSIS — B351 Tinea unguium: Secondary | ICD-10-CM

## 2017-11-28 DIAGNOSIS — D689 Coagulation defect, unspecified: Secondary | ICD-10-CM

## 2017-11-28 NOTE — Progress Notes (Signed)
  Subjective:  Patient ID: [name] [name], male    DOB: 1923-02-01,  MRN: [id number]  Chief Complaint  Patient presents with  . debride    B/L nail trimming -dugar: " don't check it" A1C:" don't know" PCP: polite LOV: x 1 year   84 y.o. male returns for routine foot care. On plavix for anticoagulation. Last saw PCP 1 year ago.  Objective:  There were no vitals filed for this visit. General AA&O x3. Normal mood and affect.  Vascular Dorsalis pedis pulses present 1+ bilaterally  Posterior tibial pulses absent bilaterally  Capillary refill normal to all digits. Pedal hair growth diminished.  Neurologic Epicritic sensation present bilaterally.  Dermatologic No open lesions. Interspaces clear of maceration.  Normal skin temperature and turgor. Hyperkeratotic lesions: none bilaterally. Nails: brittle, discoloration yellow, onychomycosis, thickening  Orthopedic: No history of amputation. MMT 5/5 in dorsiflexion, plantarflexion, inversion, and eversion. Normal lower extremity joint ROM without pain or crepitus.   Assessment & Plan:  Patient was evaluated and treated and all questions answered.  Diabetes with PAD, Coagulation Defect, Onychomycosis -Educated on diabetic footcare. Diabetic risk level 1 -At risk foot care provided as below.  Procedure: Nail Debridement Rationale: Patient meets criteria for routine foot care due to coagulation defect Type of Debridement: manual, sharp debridement. Instrumentation: Nail nipper, rotary burr. Number of Nails: 10     Return in about 3 months (around 02/27/2018) for Routine Foot Care - on anticaogulant.

## 2018-02-06 DIAGNOSIS — E119 Type 2 diabetes mellitus without complications: Secondary | ICD-10-CM | POA: Diagnosis not present

## 2018-02-06 DIAGNOSIS — Z961 Presence of intraocular lens: Secondary | ICD-10-CM | POA: Diagnosis not present

## 2018-02-06 DIAGNOSIS — D4981 Neoplasm of unspecified behavior of retina and choroid: Secondary | ICD-10-CM | POA: Diagnosis not present

## 2018-02-20 DIAGNOSIS — Z7189 Other specified counseling: Secondary | ICD-10-CM | POA: Diagnosis not present

## 2018-02-20 DIAGNOSIS — R413 Other amnesia: Secondary | ICD-10-CM | POA: Diagnosis not present

## 2018-02-20 DIAGNOSIS — E039 Hypothyroidism, unspecified: Secondary | ICD-10-CM | POA: Diagnosis not present

## 2018-02-20 DIAGNOSIS — I639 Cerebral infarction, unspecified: Secondary | ICD-10-CM | POA: Diagnosis not present

## 2018-02-20 DIAGNOSIS — E1169 Type 2 diabetes mellitus with other specified complication: Secondary | ICD-10-CM | POA: Diagnosis not present

## 2018-02-20 DIAGNOSIS — R32 Unspecified urinary incontinence: Secondary | ICD-10-CM | POA: Diagnosis not present

## 2018-02-20 DIAGNOSIS — E7800 Pure hypercholesterolemia, unspecified: Secondary | ICD-10-CM | POA: Diagnosis not present

## 2018-02-27 ENCOUNTER — Encounter: Payer: Self-pay | Admitting: Podiatry

## 2018-02-27 ENCOUNTER — Ambulatory Visit (INDEPENDENT_AMBULATORY_CARE_PROVIDER_SITE_OTHER): Payer: Medicare Other | Admitting: Podiatry

## 2018-02-27 VITALS — BP 122/59 | HR 60 | Temp 98.0°F | Resp 16

## 2018-02-27 DIAGNOSIS — E1151 Type 2 diabetes mellitus with diabetic peripheral angiopathy without gangrene: Secondary | ICD-10-CM | POA: Diagnosis not present

## 2018-02-27 DIAGNOSIS — D689 Coagulation defect, unspecified: Secondary | ICD-10-CM

## 2018-02-27 DIAGNOSIS — E119 Type 2 diabetes mellitus without complications: Secondary | ICD-10-CM | POA: Diagnosis not present

## 2018-02-27 DIAGNOSIS — B351 Tinea unguium: Secondary | ICD-10-CM

## 2018-02-28 NOTE — Progress Notes (Signed)
  Subjective:  Patient ID: [name] [name], male    DOB: [birth year],  MRN: [id number]  Chief Complaint  Patient presents with  . debride    B/L nail trimming -pt dont check FBS   84 y.o. male returns for routine foot care. On plavix for anticoagulation. Doesn't check sugars.  Objective:   Vitals:   02/27/18 1604  BP: (!) 122/59  Pulse: 60  Resp: 16  Temp: 98 F (36.7 C)   General AA&O x3. Normal mood and affect.  Vascular Dorsalis pedis pulses present 1+ bilaterally  Posterior tibial pulses absent bilaterally  Capillary refill normal to all digits. Pedal hair growth diminished.  Neurologic Epicritic sensation present bilaterally.  Dermatologic No open lesions. Interspaces clear of maceration.  Normal skin temperature and turgor. Hyperkeratotic lesions: none bilaterally. Nails: brittle, discoloration yellow, onychomycosis, thickening  Orthopedic: No history of amputation. MMT 5/5 in dorsiflexion, plantarflexion, inversion, and eversion. Normal lower extremity joint ROM without pain or crepitus.   Assessment & Plan:  Patient was evaluated and treated and all questions answered.  Diabetes with PAD, Coagulation Defect, Onychomycosis -Educated on diabetic footcare. Diabetic risk level 1 -At risk foot care provided as below. -Annual DM exam without interval change.  Procedure: Nail Debridement Rationale: Patient meets criteria for routine foot care due to PAD, Coagulation defect. Type of Debridement: manual, sharp debridement. Instrumentation: Nail nipper, rotary burr. Number of Nails: 10        No follow-ups on file.

## 2018-05-10 DIAGNOSIS — S52501A Unspecified fracture of the lower end of right radius, initial encounter for closed fracture: Secondary | ICD-10-CM | POA: Diagnosis not present

## 2018-05-22 DIAGNOSIS — S52501D Unspecified fracture of the lower end of right radius, subsequent encounter for closed fracture with routine healing: Secondary | ICD-10-CM | POA: Diagnosis not present

## 2018-05-22 DIAGNOSIS — M25511 Pain in right shoulder: Secondary | ICD-10-CM | POA: Diagnosis not present

## 2018-05-28 DIAGNOSIS — S6291XD Unspecified fracture of right wrist and hand, subsequent encounter for fracture with routine healing: Secondary | ICD-10-CM | POA: Diagnosis not present

## 2018-05-28 DIAGNOSIS — F0280 Dementia in other diseases classified elsewhere without behavioral disturbance: Secondary | ICD-10-CM | POA: Diagnosis not present

## 2018-05-28 DIAGNOSIS — I69391 Dysphagia following cerebral infarction: Secondary | ICD-10-CM | POA: Diagnosis not present

## 2018-05-28 DIAGNOSIS — G301 Alzheimer's disease with late onset: Secondary | ICD-10-CM | POA: Diagnosis not present

## 2018-05-28 DIAGNOSIS — E119 Type 2 diabetes mellitus without complications: Secondary | ICD-10-CM | POA: Diagnosis not present

## 2018-05-28 DIAGNOSIS — K589 Irritable bowel syndrome without diarrhea: Secondary | ICD-10-CM | POA: Diagnosis not present

## 2018-05-28 DIAGNOSIS — Z9181 History of falling: Secondary | ICD-10-CM | POA: Diagnosis not present

## 2018-05-29 DIAGNOSIS — F0280 Dementia in other diseases classified elsewhere without behavioral disturbance: Secondary | ICD-10-CM | POA: Diagnosis not present

## 2018-05-29 DIAGNOSIS — I69391 Dysphagia following cerebral infarction: Secondary | ICD-10-CM | POA: Diagnosis not present

## 2018-05-29 DIAGNOSIS — G301 Alzheimer's disease with late onset: Secondary | ICD-10-CM | POA: Diagnosis not present

## 2018-05-29 DIAGNOSIS — Z9181 History of falling: Secondary | ICD-10-CM | POA: Diagnosis not present

## 2018-05-29 DIAGNOSIS — E119 Type 2 diabetes mellitus without complications: Secondary | ICD-10-CM | POA: Diagnosis not present

## 2018-05-29 DIAGNOSIS — S6291XD Unspecified fracture of right wrist and hand, subsequent encounter for fracture with routine healing: Secondary | ICD-10-CM | POA: Diagnosis not present

## 2018-05-30 ENCOUNTER — Ambulatory Visit: Payer: Medicare Other | Admitting: Podiatry

## 2018-05-30 DIAGNOSIS — I69391 Dysphagia following cerebral infarction: Secondary | ICD-10-CM | POA: Diagnosis not present

## 2018-05-30 DIAGNOSIS — S6291XD Unspecified fracture of right wrist and hand, subsequent encounter for fracture with routine healing: Secondary | ICD-10-CM | POA: Diagnosis not present

## 2018-05-30 DIAGNOSIS — Z9181 History of falling: Secondary | ICD-10-CM | POA: Diagnosis not present

## 2018-05-30 DIAGNOSIS — F0280 Dementia in other diseases classified elsewhere without behavioral disturbance: Secondary | ICD-10-CM | POA: Diagnosis not present

## 2018-05-30 DIAGNOSIS — G301 Alzheimer's disease with late onset: Secondary | ICD-10-CM | POA: Diagnosis not present

## 2018-05-30 DIAGNOSIS — E119 Type 2 diabetes mellitus without complications: Secondary | ICD-10-CM | POA: Diagnosis not present

## 2018-05-31 DIAGNOSIS — S6291XD Unspecified fracture of right wrist and hand, subsequent encounter for fracture with routine healing: Secondary | ICD-10-CM | POA: Diagnosis not present

## 2018-05-31 DIAGNOSIS — Z9181 History of falling: Secondary | ICD-10-CM | POA: Diagnosis not present

## 2018-05-31 DIAGNOSIS — F0280 Dementia in other diseases classified elsewhere without behavioral disturbance: Secondary | ICD-10-CM | POA: Diagnosis not present

## 2018-05-31 DIAGNOSIS — E119 Type 2 diabetes mellitus without complications: Secondary | ICD-10-CM | POA: Diagnosis not present

## 2018-05-31 DIAGNOSIS — I69391 Dysphagia following cerebral infarction: Secondary | ICD-10-CM | POA: Diagnosis not present

## 2018-05-31 DIAGNOSIS — G301 Alzheimer's disease with late onset: Secondary | ICD-10-CM | POA: Diagnosis not present

## 2018-06-01 DIAGNOSIS — F0280 Dementia in other diseases classified elsewhere without behavioral disturbance: Secondary | ICD-10-CM | POA: Diagnosis not present

## 2018-06-01 DIAGNOSIS — I69391 Dysphagia following cerebral infarction: Secondary | ICD-10-CM | POA: Diagnosis not present

## 2018-06-01 DIAGNOSIS — G301 Alzheimer's disease with late onset: Secondary | ICD-10-CM | POA: Diagnosis not present

## 2018-06-01 DIAGNOSIS — S6291XD Unspecified fracture of right wrist and hand, subsequent encounter for fracture with routine healing: Secondary | ICD-10-CM | POA: Diagnosis not present

## 2018-06-01 DIAGNOSIS — Z9181 History of falling: Secondary | ICD-10-CM | POA: Diagnosis not present

## 2018-06-01 DIAGNOSIS — E119 Type 2 diabetes mellitus without complications: Secondary | ICD-10-CM | POA: Diagnosis not present

## 2018-06-02 DIAGNOSIS — F0280 Dementia in other diseases classified elsewhere without behavioral disturbance: Secondary | ICD-10-CM | POA: Diagnosis not present

## 2018-06-02 DIAGNOSIS — I69391 Dysphagia following cerebral infarction: Secondary | ICD-10-CM | POA: Diagnosis not present

## 2018-06-02 DIAGNOSIS — G301 Alzheimer's disease with late onset: Secondary | ICD-10-CM | POA: Diagnosis not present

## 2018-06-02 DIAGNOSIS — E119 Type 2 diabetes mellitus without complications: Secondary | ICD-10-CM | POA: Diagnosis not present

## 2018-06-02 DIAGNOSIS — K589 Irritable bowel syndrome without diarrhea: Secondary | ICD-10-CM | POA: Diagnosis not present

## 2018-06-02 DIAGNOSIS — Z9181 History of falling: Secondary | ICD-10-CM | POA: Diagnosis not present

## 2018-06-02 DIAGNOSIS — S6291XD Unspecified fracture of right wrist and hand, subsequent encounter for fracture with routine healing: Secondary | ICD-10-CM | POA: Diagnosis not present

## 2018-07-02 DEATH — deceased
